# Patient Record
Sex: Male | Born: 1955 | Race: White | Hispanic: No | Smoking: Former smoker
Health system: Southern US, Community
[De-identification: ages and names within clinical notes are randomized; demographics above are authoritative.]

## PROBLEM LIST (undated history)

## (undated) DIAGNOSIS — Z8719 Personal history of other diseases of the digestive system: Secondary | ICD-10-CM

## (undated) DIAGNOSIS — Z9189 Other specified personal risk factors, not elsewhere classified: Secondary | ICD-10-CM

## (undated) DIAGNOSIS — K8020 Calculus of gallbladder without cholecystitis without obstruction: Secondary | ICD-10-CM

## (undated) DIAGNOSIS — M1990 Unspecified osteoarthritis, unspecified site: Secondary | ICD-10-CM

## (undated) DIAGNOSIS — N201 Calculus of ureter: Secondary | ICD-10-CM

## (undated) DIAGNOSIS — I4510 Unspecified right bundle-branch block: Secondary | ICD-10-CM

## (undated) HISTORY — PX: KNEE ARTHROSCOPY: SUR90

---

## 1998-05-14 ENCOUNTER — Ambulatory Visit (HOSPITAL_BASED_OUTPATIENT_CLINIC_OR_DEPARTMENT_OTHER): Admission: RE | Admit: 1998-05-14 | Discharge: 1998-05-14 | Payer: Self-pay | Admitting: Orthopedic Surgery

## 1998-12-09 ENCOUNTER — Ambulatory Visit (HOSPITAL_COMMUNITY): Admission: RE | Admit: 1998-12-09 | Discharge: 1998-12-09 | Payer: Self-pay | Admitting: Family Medicine

## 1998-12-09 ENCOUNTER — Encounter: Payer: Self-pay | Admitting: Family Medicine

## 1999-05-26 HISTORY — PX: ELBOW SURGERY: SHX618

## 2002-01-24 ENCOUNTER — Ambulatory Visit (HOSPITAL_BASED_OUTPATIENT_CLINIC_OR_DEPARTMENT_OTHER): Admission: RE | Admit: 2002-01-24 | Discharge: 2002-01-24 | Payer: Self-pay | Admitting: Orthopedic Surgery

## 2002-01-24 HISTORY — PX: OTHER SURGICAL HISTORY: SHX169

## 2002-10-07 ENCOUNTER — Encounter: Payer: Self-pay | Admitting: Emergency Medicine

## 2002-10-07 ENCOUNTER — Emergency Department (HOSPITAL_COMMUNITY): Admission: EM | Admit: 2002-10-07 | Discharge: 2002-10-07 | Payer: Self-pay | Admitting: Emergency Medicine

## 2004-10-07 ENCOUNTER — Inpatient Hospital Stay (HOSPITAL_COMMUNITY): Admission: RE | Admit: 2004-10-07 | Discharge: 2004-10-10 | Payer: Self-pay | Admitting: Orthopedic Surgery

## 2004-10-07 HISTORY — PX: TOTAL HIP ARTHROPLASTY: SHX124

## 2008-12-17 ENCOUNTER — Encounter: Payer: Self-pay | Admitting: Cardiovascular Disease

## 2009-10-30 ENCOUNTER — Encounter: Payer: Self-pay | Admitting: Cardiovascular Disease

## 2009-11-01 ENCOUNTER — Ambulatory Visit: Payer: Self-pay | Admitting: Cardiovascular Disease

## 2009-11-01 DIAGNOSIS — I4510 Unspecified right bundle-branch block: Secondary | ICD-10-CM

## 2009-11-26 ENCOUNTER — Ambulatory Visit: Payer: Self-pay | Admitting: Internal Medicine

## 2009-11-26 ENCOUNTER — Ambulatory Visit (HOSPITAL_COMMUNITY): Admission: RE | Admit: 2009-11-26 | Discharge: 2009-11-26 | Payer: Self-pay | Admitting: Cardiovascular Disease

## 2009-11-26 ENCOUNTER — Ambulatory Visit: Payer: Self-pay

## 2009-11-26 ENCOUNTER — Encounter: Payer: Self-pay | Admitting: Cardiovascular Disease

## 2009-11-26 HISTORY — PX: TRANSTHORACIC ECHOCARDIOGRAM: SHX275

## 2010-05-25 HISTORY — PX: TOTAL HIP REVISION: SHX763

## 2010-06-24 NOTE — Progress Notes (Signed)
Summary: [facility]  [facility] Note   Imported By: Roderic Ovens 11/19/2009 16:10:11  _____________________________________________________________________  External Attachment:    Type:   Image     Comment:   External Document

## 2010-06-24 NOTE — Assessment & Plan Note (Signed)
Summary: np6/ekg changes/jml   Visit Type:  new pt visit Referring Provider:  Rosanna Randy Primary Provider:  Rocky Crafts  CC:  Abnormal EKG.  History of Present Illness: 55 yo WM with history of hyperlipidemia, OA here today for cardiac evaluation prior to planned hip surgery. He tells  me that he had his right hip replaced in 2006 and now that prosthetic has been recalled. He is planning to have redo hip replacement on the right in the next month or two. He has been doing well. At routine visit in primary care, his EKG was noted to have an incomplete RBBB. He has had no prior cardiac issues. He denies chest pain, SOB or palpitations, near syncope or syncope. He is very active playing golf, kayaking, cycling and lifting weights.   Preventive Screening-Counseling & Management  Alcohol-Tobacco     Smoking Status: quit  Caffeine-Diet-Exercise     Does Patient Exercise: yes      Drug Use:  no.    Current Medications (verified): 1)  Xyzal 5 Mg Tabs (Levocetirizine Dihydrochloride) .Marland Kitchen.. 1 Tab Once Daily 2)  Vitamin D 2000 Unit Tabs (Cholecalciferol) .Marland Kitchen.. 1 Tab Once Daily 3)  Dhea 25 Mg Tabs (Prasterone (Dhea)) .... 1/4 Tab Once Daily  Allergies (verified): No Known Drug Allergies  Past History:  Past Medical History: Diverticulitis  Osteoarthritis Hyperlipidemia Incomplete RBBB  Past Surgical History: Status post right total hip arthroplasty 2006 Left elbow surgery in 1999 Right elbow surgery in 2001 Left and right knee arthorscopic surgeries  Family History: Father: deceased around 68 due to emphysema Mother-alive and healthy 2 brothers alive and well  Social History: Full Time for [employer] Married, 2 children Tobacco Use - Former. Smoked for 15 years, 2ppd. None since 1991.  Alcohol Use - yes Regular Exercise - yes Drug Use - no Smoking Status:  quit Does Patient Exercise:  yes Drug Use:  no  Review of Systems       The patient complains of joint pain.   The patient denies fatigue, malaise, fever, weight gain/loss, vision loss, decreased hearing, hoarseness, chest pain, palpitations, shortness of breath, prolonged cough, wheezing, sleep apnea, coughing up blood, abdominal pain, blood in stool, nausea, vomiting, diarrhea, heartburn, incontinence, blood in urine, muscle weakness, leg swelling, rash, skin lesions, headache, fainting, dizziness, depression, anxiety, enlarged lymph nodes, easy bruising or bleeding, and environmental allergies.    Vital Signs:  Patient profile:   55 year old male Height:      69 inches Weight:      176 pounds BMI:     26.08 Pulse rate:   63 / minute Pulse rhythm:   regular BP sitting:   100 / 70  (left arm) Cuff size:   large  Vitals Entered By: Danielle Rankin, CMA (November 01, 2009 3:39 PM)  Physical Exam  General:  General: Well developed, well nourished, NAD HEENT: OP clear, mucus membranes moist SKIN: warm, dry Neuro: No focal deficits Musculoskeletal: Muscle strength 5/5 all ext Psychiatric: Mood and affect normal Neck: No JVD, no carotid bruits, no thyromegaly, no lymphadenopathy. Lungs:Clear bilaterally, no wheezes, rhonci, crackles CV: RRR no murmurs, gallops rubs Abdomen: soft, NT, ND, BS present Extremities: No edema, pulses 2+.    EKG  Procedure date:  11/01/2009  Findings:      NSR, rate 63 bpm. Sinus arrhythmia. Incomplete RBBB.   Impression & Recommendations:  Problem # 1:  RIGHT BUNDLE BRANCH BLOCK (ICD-426.4) This is most likely benign. His only risk factor for  CAD is hyperlipidemia. He has no family history of premature CAD. He is very active and has had no chest pain or dyspnea on exertion. I will arrange an echocardiogram to exclude structural heart disease. I will call him with the results of the echo. We will see him as needed. I will appreciate the referral by Dr. Elisabeth Most and will communicate all results to her office.   Orders: EKG w/ Interpretation (93000) Echocardiogram  (Echo)  Patient Instructions: 1)  Your physician recommends that you schedule a follow-up appointment as needed 2)  Your physician has requested that you have an echocardiogram.  Echocardiography is a painless test that uses sound waves to create images of your heart. It provides your doctor with information about the size and shape of your heart and how well your heart's chambers and valves are working.  This procedure takes approximately one hour. There are no restrictions for this procedure.

## 2010-10-10 NOTE — Op Note (Signed)
Jay Davidson, Jay Davidson                            ACCOUNT NO.:  [id number]   MEDICAL RECORD NO.:  [id number]                   PATIENT TYPE:  AMB   LOCATION:  DSC                                  FACILITY:  [facility]   PHYSICIAN:  Katy Fitch. [name]., M.D.          DATE OF BIRTH:  [birth year]   DATE OF PROCEDURE:  01/24/2002  DATE OF DISCHARGE:                                 OPERATIVE REPORT   PREOPERATIVE DIAGNOSIS:  Chronic left lateral epicondylitis unresponsive to  more than three years of non-operative management.   POSTOPERATIVE DIAGNOSIS:  Chronic left lateral epicondylitis unresponsive to  more than three years of non-operative management.   OPERATION:  Reconstruction of left common extensor origin at elbow, lateral  epicondyle.   OPERATIVE SURGEON:  Katy Fitch. Sypher, M.D.   ASSISTANT:  [name], P.A.   ANESTHESIA:  Axillary block.   SUPERVISING ANESTHESIOLOGIST:  Maren Beach, M.D.   INDICATIONS:  The patient is a 55 year-old gentleman who has been a very  active athlete, employed by the [employer] service.  Between work and  recreational activities he has developed chronic lateral epicondylitis on  the left unresponsive to activity modification, work modification, splinting  and steroid injection.   Due to a failure to respond to non-operative measures he is brought to the  operating room at this time for reconstruction of his left condylar extensor  origin with a primary indication for surgery pain relief and secondary  indication for recovery of strength and work capacity.   PROCEDURE:  The patient was brought to the operating room and placed in a  supine position on the operative table.  Following placement of an axillary  block in the holding area by Dr. [name], anesthesia was complete in the left  arm.   One gram of Ancef was administered as a prophylactic antibiotic.   The entire left upper extremity was prepped with Betadine soak solution and  sterilely draped and a pneumatic tourniquet applied to the proximal  brachium.   Following exsanguination of the limb with an Esmarch bandage arterial  tourniquet on the proximal brachium was inflated to 220 mmHg.  The procedure  commenced with a 3 cm curvilinear incision posterior to the epicondyle  extending along the anterior aspect of the anconeus.   The tissues over the lateral epicondyle were carefully identified and  several transverse veins electrocauterized.   A 4 mm osteotome was used to elevate the common extensor origin including  the extensor carpi radialis, extensor carpi brevis and a small portion of  the brachial radialis until the muscle fibers of the brachial radialis were  identified.   On the deep surface of the extensor carpi radialis and brevis there was  cavitation and necrosis of the common extensor tendon.  There was  hyalinization of the tendinous origin.   The greater capitellar joint was opened and clear joint fluid identified.  There was no significant of significant tendon pathology involving the  common finger extensor.   The lateral epicondyle was decorticated and approximately 30 small drill  holes were created with a 0.035 inch Kirschner wire to bring blood supply  from the intermedullary canal.   Three drill holes were then placed through the lateral epicondyle utilizing  a Tapercut needle and bone awl.  A 4-0 Kevlar suture was then placed with  two horizontal mattress sutures securing the extensor carpi radialis, brevis  and longus back to an anatomic position followed by use of a running 4-0  Kevlar suture to finish the margin between the common extensor origin and  the anconeus.   The wound was then irrigated and repaired with subdermal sutures of 3-0  Vicryl and intradermal of 3-0 Prolene.   The patient was placed in a compressive dressing with his wrist in 40  degrees of dorsiflexion with a volar plaster splint maintaining this   position.   The wound was dressed with Xeroflo, sterile gauze and ace wrap.  There were  no apparent complications.   For after care the patient is given a prescription for Percocet 5/325 mg one  to two tablets p.o. q.4 to 6h p.r.n. pain.  Also he is given Keflex 500 mg  one p.o. q.8h times four hours as a prophylactic antibiotic.                                               Katy Fitch [name]., M.D.    RVS/MEDQ  D:  01/24/2002  T:  01/24/2002  Job:  [id number]

## 2010-10-10 NOTE — H&P (Signed)
NAMELABRIAN, Jay Davidson                  ACCOUNT NO.:  [id number]   MEDICAL RECORD NO.:  [id number]          PATIENT TYPE:   LOCATION:                                 FACILITY:   PHYSICIAN:  Georges Lynch. Gioffre, M.D.DATE OF BIRTH:  [birth year]   DATE OF ADMISSION:  DATE OF DISCHARGE:                                HISTORY & PHYSICAL   CHIEF COMPLAINT:  Right hip pain for several months.  The symptoms are  getting much worse.  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  The hip pain is making it difficult for him to do that.  The  patient was evaluated in the office and he was found to have degenerative  arthritis in his right hip and he elects to proceed with a right total hip  arthroplasty.   ALLERGIES:  NONE.   PAST MEDICAL HISTORY:  Diverticulitis and osteoarthritis.   CURRENT MEDICATIONS:  None.   PREVIOUS SURGICAL HISTORY:  The patient had debridement of his left knee in  1984, right knee arthroscopy in 1996, left elbow surgery in 1999 and right  elbow surgery in 2001.   FAMILY HISTORY:  Unavailable.   REVIEW OF SYSTEMS:  GENERAL:  Denies weight change, fever, chills, fatigue.  HEENT:  Denies headache, visual changes, tinnitus, hearing loss or sore  throat.  CARDIOVASCULAR:  Denies chest pain, palpitations, shortness of  breath, or orthopnea.  PULMONARY:  Denies dyspnea, wheezing, cough, sputum,  production, hemoptysis.  GASTROINTESTINAL:  Denies nausea and vomiting,  hematemesis or abdominal pain.  GENITOURINARY:  Denies dysuria, frequency,  urgency, hematuria.  ENDOCRINE:  Denies polyuria, polydipsia, appetite  changes, heat or cold intolerance.  NEUROLOGICAL:  Denies dizziness,  vertigo, syncope, seizures.  SKIN:  Denies itching, rashes, masses or moles.  MUSCULOSKELETAL:  Right hip pain.   PHYSICAL EXAMINATION:  VITAL SIGNS:  Temperature is 98.4, pulse is 76,  respirations 70, blood pressure 118/70.  GENERAL:  A 55 year old male in no acute  distress.  HEENT:  PERRLA.  EOM intact.  Pharynx clear.  NECK:  Supple without masses.  CHEST: Clear to auscultation bilaterally.  No wheezing, rales or rhonchi are  noted.  HEART:  Rate and rhythm regular without murmur.  ABDOMEN:  Positive bowel sounds, soft and nontender.  EXTREMITIES:  Examination of his right hip reveals painful range of motion  of his right hip.  SKIN:  Warm and dry.   X-RAYS:  Reveal degenerative arthritis of his right hip.   IMPRESSION:  Reveal degenerative arthritis of his right hip.   PLAN:  The patient is to be admitted to [facility] Oct 07, 2004  for a right total hip arthroplasty.      LKP/MEDQ  D:  10/06/2004  T:  10/06/2004  Job:  [id number]

## 2010-10-10 NOTE — Op Note (Signed)
NAMEPAULMICHAEL, SCHRECK                  ACCOUNT NO.:  [id number]   MEDICAL RECORD NO.:  [id number]          PATIENT TYPE:  INP   LOCATION:  0007                         [facility]ACILITY:  [facility]   PHYSICIAN:  Georges Lynch. Gioffre, M.D.DATE OF BIRTH:  01-11-1956   DATE OF PROCEDURE:  10/07/2004  DATE OF DISCHARGE:                                 OPERATIVE REPORT   CHIEF COMPLAINT:  Pain in the right hip. He had plain x-rays and MRI of his  right hip that showed degenerative arthritic changes.   PREOPERATIVE DIAGNOSIS:  Degenerative arthritis, right hip.   POSTOPERATIVE DIAGNOSIS:  Degenerative arthritis, right hip.   OPERATION:  Right total hip arthroplasty utilizing the DePuy metal and metal  total hip system. The cup was an ASR acetabular cup measuring 56 mm in  diameter, porous coated. The femoral implant, ____________ , was a size 49  ASR metal. The hip stem was a 11.3 porous-coated hip stem, with a lateral  offset. We utilized a +5 tapered sleeve.   ASSISTANT:  Madlyn Frankel. Charlann Boxer, M.D.   PROCEDURE:  Under general anesthesia, routine orthopedic prepping and  draping of the right hip was carried out. The patient had 1 g of IV Ancef.  At this time, posterolateral approach to the right hip was carried out;  bleeders then applied and cauterized. Self-retaining retractors were  inserted. I then incised the iliotibial band and then dissected the gluteal  muscle by blunt dissection. The self-retaining retractors were advanced. I  then partially detached external rotators and at this time did a  capsulectomy and dislocated the femoral head. We then amputated the femoral  head at the appropriate neck length. Following that, we inserted our canal  finder into the femoral shaft and the rasped the femoral shaft up to 11.3 mm  femoral stem. Once this was completed, we then completed our capsulectomy. I  then reamed the acetabulum out to a size 55 mm for a 56 mm cup. Then, we  went through the trials after we  inserted our permanent metal cup, and we  then finally selected a +5 tapered stem with a 49-mm bulb. We thoroughly  irrigated the area out, after we went through the trials, we removed the  trial, and then inserted our permanent size 11.3 femoral stem with a +5  tapered neck and a 49-mm bulb. We then made sure there were no soft tissue  structures in the acetabulum. We irrigated out the acetabulum, dried the  socket out, and then reduced the hip. It had excellent function and  excellent motion. Great attention was paid toward the achievement of leg  length equality. He was short about a half inch going into surgery. We were  satisfied with the leg lengths with the +5 tapered neck. We then thoroughly  irrigated out the area and reattached the soft tissue structures in usual  fashion. Skin was closed with metal staples, and a sterile neosporin  dressing was applied. The patient left the operating room in satisfactory  condition.      RAG/MEDQ  D:  10/07/2004  T:  10/07/2004  Job:  [id number]

## 2010-10-10 NOTE — Discharge Summary (Signed)
Jay Davidson, Jay Davidson                  ACCOUNT NO.:  [id number]   MEDICAL RECORD NO.:  [id number]          PATIENT TYPE:  INP   LOCATION:  1518                         FACILITY:  [facility]   PHYSICIAN:  Jay Davidson, M.D.DATE OF BIRTH:  1956/05/24   DATE OF ADMISSION:  10/07/2004  DATE OF DISCHARGE:  10/10/2004                                 DISCHARGE SUMMARY   ADMISSION DIAGNOSES:  1.  Degenerative arthritis right hip.  2.  Diverticulitis.  3.  Osteoarthritis.   DISCHARGE DIAGNOSES:  1.  Status post right total hip arthroplasty.  2.  Diverticulitis.  3.  Osteoarthritis.   PROCEDURES/OPERATIONS:  The patient had a right total knee arthroplasty on  Oct 07, 2004. Surgeon:  Jay Davidson, M.D. Assistant:  Jay Davidson, M.D.  Surgery was performed under general anesthesia.   BRIEF HISTORY:  This is a 55 year old male who has had right hip pain for  several months. His symptoms are progressing to the point where it is very  difficult for him to ambulate. 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 The pain is making it very difficult for him to do his  job. The patient was evaluated in the office and was found to have  significant degenerative arthritis of his right hip and would like to  proceed with a right total hip arthroplasty and was admitted to the hospital  for same.   LABORATORY DATA:  Admission CBC:  WBC 4.8, RBC 4.88, hemoglobin 14.8,  hematocrit 43.8, platelet count 253. Admission PT 11.5, INR 0.8, PTT 28.  Admission chemistry:  Sodium is 139, potassium is 4.5, chloride 103, CO2 29,  glucose 107, slightly elevated BUN 6, creatinine 1.0, calcium 9.6, total  protein 7.0, albumin 4.3. Admission urinalysis is normal. The patient's  blood type is O positive, negative antibody screen. Admission EKG:  Normal  sinus rhythm at a rate of 63. Admission chest x-ray:  No active disease.  Preoperative x-ray of his right hip revealed osteoarthritis of his right  hip. Postoperative x-ray of his right hip revealed right total hip  arthroplasty in good alignment.   HOSPITAL COURSE:  The patient was taken to the operating room. He underwent  the above-stated procedure without complication. He tolerated the procedure  well and was allowed to return to the recovery room and then the orthopedic  floor to continue his postoperative care. The patient was placed on PC  analgesia for pain control. The patient's pain was well controlled and he  tolerated the PCA well. He was weaned over to oral analgesics and the PCA  was discontinued on postoperative day #2. The patient's hemoglobin and  hematocrit was followed throughout his hospitalization. He had a  postoperative drop in his hemoglobin to 9.1 but that stabilized prior to  discharge and he did not require blood transfusions. PT was consulted for  gait training ambulation. He was able to partial weight-bear with the aid of  a walker without much difficulty. The patient did remarkably well and was  quite stable and was able to  be discharged home on postoperative day #3 -  the discharge date is Oct 10, 2004.   DISCHARGE MEDICATIONS:  1.  Coumadin 5 mg daily.  2.  Trinsicon one tablet twice daily.  3.  Percocet 10/650 one or two tablets q.6h. as needed for pain.  4.  Robaxin 500 mg one q.6h. as needed for spasm.   ACTIVITY:  Partial weightbearing with a walker.   WOUND CARE:  Daily dressing changes. Follow total hip precautions.   FOLLOW-UP:  The patient is scheduled to follow up with Dr. Darrelyn Davidson 2 weeks  from the date of surgery and he will call the office to schedule the  appointment.   CONDITION AT THE TIME OF DISCHARGE:  Stable.      Jay Davidson  D:  11/05/2004  T:  11/05/2004  Job:  [id number]

## 2011-04-21 DIAGNOSIS — T8484XA Pain due to internal orthopedic prosthetic devices, implants and grafts, initial encounter: Secondary | ICD-10-CM | POA: Insufficient documentation

## 2011-04-21 DIAGNOSIS — M169 Osteoarthritis of hip, unspecified: Secondary | ICD-10-CM | POA: Insufficient documentation

## 2011-06-18 ENCOUNTER — Telehealth: Payer: Self-pay | Admitting: Cardiovascular Disease

## 2011-06-18 NOTE — Telephone Encounter (Signed)
Echo,12 faxed to [facility] @ [phone] 06/18/11/KM

## 2011-12-28 ENCOUNTER — Ambulatory Visit (INDEPENDENT_AMBULATORY_CARE_PROVIDER_SITE_OTHER): Payer: 59 | Admitting: Family Medicine

## 2011-12-28 ENCOUNTER — Telehealth: Payer: Self-pay

## 2011-12-28 ENCOUNTER — Ambulatory Visit: Payer: 59

## 2011-12-28 VITALS — BP 110/70 | HR 54 | Temp 98.2°F | Resp 16 | Ht 68.0 in | Wt 178.2 lb

## 2011-12-28 DIAGNOSIS — S8990XA Unspecified injury of unspecified lower leg, initial encounter: Secondary | ICD-10-CM

## 2011-12-28 DIAGNOSIS — S4980XA Other specified injuries of shoulder and upper arm, unspecified arm, initial encounter: Secondary | ICD-10-CM

## 2011-12-28 DIAGNOSIS — M25569 Pain in unspecified knee: Secondary | ICD-10-CM

## 2011-12-28 NOTE — Telephone Encounter (Signed)
What is he taking now?  How much pain is he in?  Does he need to be able to work while taking this medication?

## 2011-12-28 NOTE — Telephone Encounter (Signed)
Patient saw Dr. Patsy Lager today for knee injury and refused pain meds. Patient now would like those called into [facility] [address]. Please call pt at [phone] when done.

## 2011-12-28 NOTE — Telephone Encounter (Signed)
See note, can we rx some medicine for pain?

## 2011-12-28 NOTE — Progress Notes (Signed)
[facility] [address] [place] [address] [phone]  Date:  12/28/2011   Name:  Jay Davidson   DOB:  05-24-1956   MRN:  [id number]  PCP:  No primary provider on file.    Chief Complaint: Knee Pain   History of Present Illness:  Jay Davidson is a 56 y.o. very pleasant male patient who presents with the following:  History of left knee problem from years ago. 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  He still has significant pain which makes it hard to walk.  He will occasionally have some knee pain, but never this severe since his original injury and surgery.   The knee is popping and clicking, and it does feel unstable.   He has an orthopedist at [facility]- he has had his right hip done (total hip) twice so far.    Patient Active Problem List  Diagnosis  . RIGHT BUNDLE BRANCH BLOCK    No past medical history on file.  No past surgical history on file.  History  Substance Use Topics  . Smoking status: Former Games developer  . Smokeless tobacco: Not on file  . Alcohol Use: Not on file    No family history on file.  No Known Allergies  Medication list has been reviewed and updated.  Current Outpatient Prescriptions on File Prior to Visit  Medication Sig Dispense Refill  . cetirizine (ZYRTEC) 10 MG tablet Take 10 mg by mouth daily.        Review of Systems:  As per HPI- otherwise negative.  Physical Examination: Filed Vitals:   12/28/11 0901  BP: 110/70  Pulse: 54  Temp: 98.2 F (36.8 C)  Resp: 16   Filed Vitals:   12/28/11 0901  Height: 5\' 8"  (1.727 m)  Weight: 178 lb 3.2 oz (80.831 kg)   Body mass index is 27.10 kg/(m^2). Ideal Body Weight: Weight in (lb) to have BMI = 25: 164.1    GEN: WDWN, NAD, Non-toxic, Alert & Oriented x 3 HEENT: Atraumatic, Normocephalic.  Ears and  Nose: No external deformity. EXTR: No clubbing/cyanosis/edema NEURO: Normal gait.  PSYCH: Normally interactive. Conversant. Not depressed or anxious appearing.  Calm demeanor.  Left knee:  Small effusion, mild swelling.  Tender along the front of the knee.  No crepitus, no heat or redness.   Feels stable.    [facility] reading (PRIMARY) by  Dr. Patsy Lager  Left knee:negative LEFT KNEE - COMPLETE 4+ VIEW  Comparison: None.  Findings: There is slight fullness on the lateral image of the density in the suprapatellar region. This suggest possibility of a small amount of joint effusion. Alignment is normal. Joint spaces are preserved. No fracture or dislocation is evident. No chondrocalcinosis or opaque loose body is evident. There is a small enthesophyte of the anterior superior aspect of the patella.  IMPRESSION: Question small joint effusion. Small enthesophyte of anterior superior aspect of patella. No other abnormalities identified.  Assessment and Plan: 1. Pain, knee    2. Injury, knee  DG Knee Complete 4 Views Left   Suspect that Trayden has re injured his meniscus.  Crutches, hinged knee brace, elevate, ice.  He will contact his orthopedist for follow- up, gave out of work note.  Let me know if we can help  in any way.   Abbe Amsterdam, MD

## 2011-12-29 MED ORDER — HYDROCODONE-ACETAMINOPHEN 5-325 MG PO TABS: 1.0000 | ORAL_TABLET | ORAL | Status: AC | PRN

## 2011-12-29 NOTE — Telephone Encounter (Signed)
Left message for him to call me back to advise on this.

## 2011-12-29 NOTE — Telephone Encounter (Signed)
Faxed in Rx and notified pt done.

## 2011-12-29 NOTE — Telephone Encounter (Signed)
Done and printed

## 2011-12-29 NOTE — Telephone Encounter (Signed)
I have spoken to patient he is s/p hip replacement and is taking tramadol with no relief he is home out of work with his new knee injury, he requests something stronger sent to [facility].

## 2012-11-30 ENCOUNTER — Encounter (HOSPITAL_COMMUNITY): Payer: Self-pay | Admitting: *Deleted

## 2012-11-30 ENCOUNTER — Emergency Department (HOSPITAL_COMMUNITY): Payer: Managed Care, Other (non HMO)

## 2012-11-30 ENCOUNTER — Emergency Department (HOSPITAL_COMMUNITY)
Admission: EM | Admit: 2012-11-30 | Discharge: 2012-11-30 | Disposition: A | Discharge status: Home / Self Care | Payer: Managed Care, Other (non HMO) | Attending: Emergency Medicine | Admitting: Emergency Medicine

## 2012-11-30 DIAGNOSIS — N200 Calculus of kidney: Secondary | ICD-10-CM | POA: Insufficient documentation

## 2012-11-30 DIAGNOSIS — Z79899 Other long term (current) drug therapy: Secondary | ICD-10-CM | POA: Insufficient documentation

## 2012-11-30 DIAGNOSIS — Z87891 Personal history of nicotine dependence: Secondary | ICD-10-CM | POA: Insufficient documentation

## 2012-11-30 DIAGNOSIS — K8020 Calculus of gallbladder without cholecystitis without obstruction: Secondary | ICD-10-CM | POA: Insufficient documentation

## 2012-11-30 DIAGNOSIS — R110 Nausea: Secondary | ICD-10-CM | POA: Insufficient documentation

## 2012-11-30 DIAGNOSIS — R300 Dysuria: Secondary | ICD-10-CM | POA: Insufficient documentation

## 2012-11-30 DIAGNOSIS — Z7982 Long term (current) use of aspirin: Secondary | ICD-10-CM | POA: Insufficient documentation

## 2012-11-30 LAB — BASIC METABOLIC PANEL
CO2: 30 mEq/L (ref 19–32)
Calcium: 9.6 mg/dL (ref 8.4–10.5)
Chloride: 99 mEq/L (ref 96–112)
GFR calc Af Amer: >90 mL/min (ref >90)
Sodium: 137 mEq/L (ref 135–145)

## 2012-11-30 LAB — CBC WITH DIFFERENTIAL/PLATELET
Basophils Absolute: 0 10*3/uL (ref 0.0–0.1)
Basophils Relative: 0 % (ref 0–1)
Eosinophils Relative: 1 % (ref 0–5)
Lymphocytes Relative: 13 % (ref 12–46)
MCHC: 33.7 g/dL (ref 30.0–36.0)
Neutro Abs: 7.9 10*3/uL — ABNORMAL HIGH (ref 1.7–7.7)
Platelets: 252 10*3/uL (ref 150–400)
RDW: 12.3 % (ref 11.5–15.5)
WBC: 9.8 10*3/uL (ref 4.0–10.5)

## 2012-11-30 LAB — URINALYSIS, ROUTINE W REFLEX MICROSCOPIC
Bilirubin Urine: NEGATIVE
Ketones, ur: NEGATIVE mg/dL
Specific Gravity, Urine: 1.03 (ref 1.005–1.030)
Urobilinogen, UA: 0.2 mg/dL (ref 0.0–1.0)

## 2012-11-30 LAB — URINE MICROSCOPIC-ADD ON

## 2012-11-30 MED ORDER — OXYCODONE-ACETAMINOPHEN 5-325 MG PO TABS
2.0000 | ORAL_TABLET | Freq: Once | ORAL | Status: AC
  Administered 2012-11-30: 2 via ORAL
  Filled 2012-11-30: qty 2

## 2012-11-30 MED ORDER — ONDANSETRON HCL 4 MG PO TABS: 4.0000 mg | ORAL_TABLET | Freq: Three times a day (TID) | ORAL | Status: DC | PRN

## 2012-11-30 MED ORDER — OXYCODONE-ACETAMINOPHEN 5-325 MG PO TABS: ORAL_TABLET | ORAL | Status: DC

## 2012-11-30 MED ORDER — TAMSULOSIN HCL 0.4 MG PO CAPS: 0.4000 mg | ORAL_CAPSULE | Freq: Every day | ORAL | Status: DC

## 2012-11-30 MED ORDER — ONDANSETRON 4 MG PO TBDP
4.0000 mg | ORAL_TABLET | Freq: Once | ORAL | Status: AC
  Administered 2012-11-30: 4 mg via ORAL
  Filled 2012-11-30: qty 1

## 2012-11-30 NOTE — ED Notes (Signed)
Pt c/o sudden onset left flank pain at 1600 today. Pt unable to sit still in triage.

## 2012-11-30 NOTE — ED Provider Notes (Signed)
History    CSN: [id number] Arrival date & time 11/30/12  1735  First MD Initiated Contact with Patient 11/30/12 1811     Chief Complaint  Patient presents with  . Flank Pain   (Consider location/radiation/quality/duration/timing/severity/associated sxs/prior Treatment) HPI Comments: 57 y.o. Male with no significant medical hx presents today complaining of sudden onset left flank pain that started about 4pm today. This is a new problem. Pt describes pain as sharp, constant, worsening severe at 10/10, radiating to left groin. Worse with sitting up. Admits dysuria. Denies fever, chills, nausea, vomiting, hematuria, chest pain, shortness of breath.   Patient is a 57 y.o. male presenting with flank pain.  Flank Pain Associated symptoms include nausea and urinary symptoms. Pertinent negatives include no abdominal pain, change in bowel habit, chest pain, chills, coughing, diaphoresis, fever, headaches, neck pain, numbness, rash, vomiting or weakness.   History reviewed. No pertinent past medical history. History reviewed. No pertinent past surgical history. History reviewed. No pertinent family history. History  Substance Use Topics  . Smoking status: Former Games developer  . Smokeless tobacco: Not on file  . Alcohol Use: Yes    Review of Systems  Constitutional: Negative for fever, chills and diaphoresis.  HENT: Negative for neck pain and neck stiffness.   Eyes: Negative for visual disturbance.  Respiratory: Negative for apnea, cough, chest tightness and shortness of breath.   Cardiovascular: Negative for chest pain and palpitations.  Gastrointestinal: Positive for nausea. Negative for vomiting, abdominal pain, diarrhea, constipation and change in bowel habit.  Genitourinary: Positive for dysuria and flank pain.       Left sided  Musculoskeletal: Negative for gait problem.  Skin: Negative for rash.  Neurological: Negative for dizziness, weakness, light-headedness, numbness and headaches.     Allergies  Review of patient's allergies indicates no known allergies.  Home Medications   Current Outpatient Rx  Name  Route  Sig  Dispense  Refill  . aspirin 81 MG chewable tablet   Oral   Chew 81 mg by mouth daily.         . Multiple Vitamins-Minerals (MULTIVITAMIN WITH MINERALS) tablet   Oral   Take 1 tablet by mouth daily.         . traMADol (ULTRAM) 50 MG tablet   Oral   Take 50 mg by mouth every 6 (six) hours as needed (pain).           BP 178/59  Pulse 58  Temp(Src) 98.4 F (36.9 C) (Oral)  Resp 20  SpO2 99% Physical Exam  Nursing note and vitals reviewed. Constitutional: He is oriented to person, place, and time. He appears well-developed and well-nourished. No distress.  uncomfortable  HENT:  Head: Normocephalic and atraumatic.  Eyes: Conjunctivae and EOM are normal.  Neck: Normal range of motion. Neck supple.  No meningeal signs  Cardiovascular: Normal rate, regular rhythm and normal heart sounds.  Exam reveals no gallop and no friction rub.   No murmur heard. Pulmonary/Chest: Effort normal and breath sounds normal. No respiratory distress. He has no wheezes. He has no rales. He exhibits no tenderness.  Abdominal: Soft. Bowel sounds are normal. He exhibits no distension. There is tenderness. There is no rebound and no guarding.    Left CVA tenderness radiating to left groin  Musculoskeletal: Normal range of motion. He exhibits no edema and no tenderness.  Neurological: He is alert and oriented to person, place, and time. No cranial nerve deficit.  Skin: Skin is warm and dry. He  is not diaphoretic. No erythema.       ED Course  Procedures (including critical care time)  Medications  oxyCODONE-acetaminophen (PERCOCET/ROXICET) 5-325 MG per tablet 2 tablet (2 tablets Oral Given 11/30/12 1902)  ondansetron (ZOFRAN-ODT) disintegrating tablet 4 mg (4 mg Oral Given 11/30/12 1903)    Labs Reviewed  URINALYSIS, ROUTINE W REFLEX MICROSCOPIC -  Abnormal; Notable for the following:    APPearance CLOUDY (*)    Hgb urine dipstick LARGE (*)    All other components within normal limits  CBC WITH DIFFERENTIAL - Abnormal; Notable for the following:    Neutrophils Relative % 81 (*)    Neutro Abs 7.9 (*)    All other components within normal limits  BASIC METABOLIC PANEL - Abnormal; Notable for the following:    Glucose, Bld 121 (*)    All other components within normal limits  URINE MICROSCOPIC-ADD ON   Ct Abdomen Pelvis Wo Contrast  11/30/2012   *RADIOLOGY REPORT*  Clinical Data: Left flank pain, left groin pain.  CT ABDOMEN AND PELVIS WITHOUT CONTRAST  Technique:  Multidetector CT imaging of the abdomen and pelvis was performed following the standard protocol without intravenous contrast.  Comparison: None  Findings: Lung bases are clear.  No effusions.  Heart is normal size.  11 mm gallstone layering dependently in the gallbladder fundus. Small low-density lesion in the right hepatic lobe measures 8 mm, likely small cyst although this cannot be fully characterized due to small size and lack of intravenous contrast.  Spleen, pancreas, adrenals and right kidney have an unremarkable unenhanced appearance.  There is mild left hydronephrosis due to a 6 mm stone in the mid left ureter at the pelvic brim.  The ureter is decompressed below this level.  Urinary bladder is decompressed, grossly unremarkable.  Appendix is visualized and is normal. Bowel grossly unremarkable.  No free fluid, free air, or adenopathy.  Prior right hip replacement.  No acute bony abnormality.  Aorta is normal caliber.  IMPRESSION: 6 mm mid left ureteral stone with mild left hydronephrosis.  Cholelithiasis.   Original Report Authenticated By: Charlett Nose, M.D.   1. Nephrolithiasis   2. Gall stones    Filed Vitals:   11/30/12 1745  BP: 178/59  Pulse: 58  Temp: 98.4 F (36.9 C)  TempSrc: Oral  Resp: 20  SpO2: 99%    New Prescriptions   ONDANSETRON (ZOFRAN) 4 MG  TABLET    Take 1 tablet (4 mg total) by mouth every 8 (eight) hours as needed for nausea.   OXYCODONE-ACETAMINOPHEN (PERCOCET/ROXICET) 5-325 MG PER TABLET    Take one or two tablets every 4 to 6 hours as needed for pain   TAMSULOSIN (FLOMAX) 0.4 MG CAPS    Take 1 capsule (0.4 mg total) by mouth daily.    MDM  Pt has been diagnosed with a Kidney Stone via CT with mild hydronephrosis. Serum creatine WNL, vitals sign stable and the pt does not have irratractable vomiting. Incidental finding of asymptomatic gall stones on CT. Pt has no complaints of RUQ pain today or pain after eating. Pt denies diet containing fatty foods and has never experienced RUQ pain.  At this time there does not appear to be any evidence of an acute emergency medical condition and the patient appears stable for discharge with appropriate outpatient follow up. Provided urology contact info, outpt surgery with [facility] for gall stones, pain meds, zofran, flomax, and strainer. Diagnosis was discussed with patient who verbalizes understanding and is  agreeable to discharge. Pt case discussed with Dr. Dierdre Highman who agrees with my plan.    Glade Nurse, PA-C 11/30/12 2335

## 2012-11-30 NOTE — ED Provider Notes (Signed)
Medical screening examination/treatment/procedure(s) were performed by non-physician practitioner and as supervising physician I was immediately available for consultation/collaboration.  Sunnie Nielsen, MD 11/30/12 [phone]

## 2012-12-13 ENCOUNTER — Ambulatory Visit (INDEPENDENT_AMBULATORY_CARE_PROVIDER_SITE_OTHER): Payer: 59 | Admitting: Emergency Medicine

## 2012-12-13 VITALS — BP 144/86 | HR 75 | Temp 98.5°F | Resp 18 | Ht 68.0 in | Wt 177.0 lb

## 2012-12-13 DIAGNOSIS — L0201 Cutaneous abscess of face: Secondary | ICD-10-CM

## 2012-12-13 DIAGNOSIS — L03211 Cellulitis of face: Secondary | ICD-10-CM

## 2012-12-13 MED ORDER — CLINDAMYCIN HCL 300 MG PO CAPS: 300.0000 mg | ORAL_CAPSULE | Freq: Three times a day (TID) | ORAL | Status: DC

## 2012-12-13 NOTE — Progress Notes (Signed)
[facility] [address], [place] [address] [phone]  Date:  12/13/2012   Name:  Jay Davidson   DOB:  [birth year]   MRN:  [id number]  PCP:  No primary provider on file.    Chief Complaint: infected nose hair   History of Present Illness:  Jay Davidson is a 57 y.o. very pleasant male patient who presents with the following:  Sudden onset of swelling and pain in his left side of the nose.  No fever or chills.  No history of injury.  No improvement with over the counter medications or other home remedies. Denies other complaint or health concern today.   Patient Active Problem List   Diagnosis Date Noted  . RIGHT BUNDLE BRANCH BLOCK 11/01/2009    Past Medical History  Diagnosis Date  . Allergy     Past Surgical History  Procedure Laterality Date  . Total hip arthroplasty      History  Substance Use Topics  . Smoking status: Former Games developer  . Smokeless tobacco: Not on file  . Alcohol Use: Yes    History reviewed. No pertinent family history.  No Known Allergies  Medication list has been reviewed and updated.  Current Outpatient Prescriptions on File Prior to Visit  Medication Sig Dispense Refill  . aspirin 81 MG chewable tablet Chew 81 mg by mouth daily.      . Multiple Vitamins-Minerals (MULTIVITAMIN WITH MINERALS) tablet Take 1 tablet by mouth daily.      Marland Kitchen oxyCODONE-acetaminophen (PERCOCET/ROXICET) 5-325 MG per tablet Take one or two tablets every 4 to 6 hours as needed for pain  20 tablet  0  . tamsulosin (FLOMAX) 0.4 MG CAPS Take 1 capsule (0.4 mg total) by mouth daily.  10 capsule  0  . traMADol (ULTRAM) 50 MG tablet Take 50 mg by mouth every 6 (six) hours as needed (pain).       . ondansetron (ZOFRAN) 4 MG tablet Take 1 tablet (4 mg total) by mouth every 8 (eight) hours as needed for nausea.  10 tablet  0   No current facility-administered medications on file prior to visit.    Review of Systems:  As per HPI, otherwise  negative.    Physical Examination: Filed Vitals:   12/13/12 1655  BP: 144/86  Pulse: 75  Temp: 98.5 F (36.9 C)  Resp: 18   Filed Vitals:   12/13/12 1655  Height: 5\' 8"  (1.727 m)  Weight: 177 lb (80.287 kg)   Body mass index is 26.92 kg/(m^2). Ideal Body Weight: Weight in (lb) to have BMI = 25: 164.1   GEN: WDWN, NAD, Non-toxic, Alert & Oriented x 3 HEENT: Atraumatic, Normocephalic.  Ears and Nose: No external deformity. EXTR: No clubbing/cyanosis/edema NEURO: Normal gait.  PSYCH: Normally interactive. Conversant. Not depressed or anxious appearing.  Calm demeanor.  Has a swollen erythematous nose more tender on left side.  No lymphangitis.  Not fluctuant.  Assessment and Plan: Cellulitis nose Clindamycin  Signed,  Phillips Odor, MD

## 2012-12-13 NOTE — Patient Instructions (Addendum)
Abscess An abscess is an infected area that contains a collection of pus and debris.It can occur in almost any part of the body. An abscess is also known as a furuncle or boil. CAUSES  An abscess occurs when tissue gets infected. This can occur from blockage of oil or sweat glands, infection of hair follicles, or a minor injury to the skin. As the body tries to fight the infection, pus collects in the area and creates pressure under the skin. This pressure causes pain. People with weakened immune systems have difficulty fighting infections and get certain abscesses more often.  SYMPTOMS Usually an abscess develops on the skin and becomes a painful mass that is red, warm, and tender. If the abscess forms under the skin, you may feel a moveable soft area under the skin. Some abscesses break open (rupture) on their own, but most will continue to get worse without care. The infection can spread deeper into the body and eventually into the bloodstream, causing you to feel ill.  DIAGNOSIS  Your caregiver will take your medical history and perform a physical exam. A sample of fluid may also be taken from the abscess to determine what is causing your infection. TREATMENT  Your caregiver may prescribe antibiotic medicines to fight the infection. However, taking antibiotics alone usually does not cure an abscess. Your caregiver may need to make a small cut (incision) in the abscess to drain the pus. In some cases, gauze is packed into the abscess to reduce pain and to continue draining the area. HOME CARE INSTRUCTIONS   Only take over-the-counter or prescription medicines for pain, discomfort, or fever as directed by your caregiver.  If you were prescribed antibiotics, take them as directed. Finish them even if you start to feel better.  If gauze is used, follow your caregiver's directions for changing the gauze.  To avoid spreading the infection:  Keep your draining abscess covered with a  bandage.  Wash your hands well.  Do not share personal care items, towels, or whirlpools with others.  Avoid skin contact with others.  Keep your skin and clothes clean around the abscess.  Keep all follow-up appointments as directed by your caregiver. SEEK MEDICAL CARE IF:   You have increased pain, swelling, redness, fluid drainage, or bleeding.  You have muscle aches, chills, or a general ill feeling.  You have a fever. MAKE SURE YOU:   Understand these instructions.  Will watch your condition.  Will get help right away if you are not doing well or get worse. Document Released: 02/18/2005 Document Revised: 11/10/2011 Document Reviewed: 07/24/2011 [employer] Patient Information 2014 [employer].  

## 2013-03-29 ENCOUNTER — Ambulatory Visit: Payer: 59

## 2013-05-03 ENCOUNTER — Ambulatory Visit (INDEPENDENT_AMBULATORY_CARE_PROVIDER_SITE_OTHER): Payer: 59 | Admitting: Family Medicine

## 2013-05-03 VITALS — BP 122/70 | HR 62 | Temp 98.4°F | Resp 18 | Ht 68.0 in | Wt 179.0 lb

## 2013-05-03 DIAGNOSIS — S43429A Sprain of unspecified rotator cuff capsule, initial encounter: Secondary | ICD-10-CM

## 2013-05-03 DIAGNOSIS — S43422A Sprain of left rotator cuff capsule, initial encounter: Secondary | ICD-10-CM

## 2013-05-03 MED ORDER — HYDROCODONE-ACETAMINOPHEN 5-325 MG PO TABS: 1.0000 | ORAL_TABLET | Freq: Four times a day (QID) | ORAL | Status: DC | PRN

## 2013-05-03 MED ORDER — CYCLOBENZAPRINE HCL 10 MG PO TABS: 10.0000 mg | ORAL_TABLET | Freq: Three times a day (TID) | ORAL | Status: DC | PRN

## 2013-05-03 MED ORDER — DICLOFENAC SODIUM 75 MG PO TBEC: 75.0000 mg | DELAYED_RELEASE_TABLET | Freq: Two times a day (BID) | ORAL | Status: DC

## 2013-05-03 NOTE — Patient Instructions (Signed)
Ice should be applied for 10 to 15 minutes every 2 to 3 hours for inflammation and pain, and immediately after activity that aggravates your symptoms. Use ice packs or an ice massage. OK to baby shoulder for 2-3 days with medications and frequent icing. Then start below stretching exercises and in another week can start the strengthening exercises.  If you are worsening, return to clinic for xray and repeat exam - then we will determine if you can be further treated best by physical therapy or if you need an MRI and/or referral to an orthopedist.  If you are getting somewhat better, ok to wait until 4 weeks but if not all the way improved by 6 weeks recommend return to clinic for further evaluation. Do not use any other otc pain medication other than tylenol/acetaminophen - so no aleve, ibuprofen, motrin, advil, etc while you are on the diclofenac.  Impingement Syndrome, Rotator Cuff, Bursitis with Rehab Impingement syndrome is a condition that involves inflammation of the tendons of the rotator cuff and the subacromial bursa, that causes pain in the shoulder. The rotator cuff consists of four tendons and muscles that control much of the shoulder and upper arm function. The subacromial bursa is a fluid filled sac that helps reduce friction between the rotator cuff and one of the bones of the shoulder (acromion). Impingement syndrome is usually an overuse injury that causes swelling of the bursa (bursitis), swelling of the tendon (tendonitis), and/or a tear of the tendon (strain). Strains are classified into three categories. Grade 1 strains cause pain, but the tendon is not lengthened. Grade 2 strains include a lengthened ligament, due to the ligament being stretched or partially ruptured. With grade 2 strains there is still function, although the function may be decreased. Grade 3 strains include a complete tear of the tendon or muscle, and function is usually impaired. SYMPTOMS   Pain around the shoulder,  often at the outer portion of the upper arm.  Pain that gets worse with shoulder function, especially when reaching overhead or lifting.  Sometimes, aching when not using the arm.  Pain that wakes you up at night.  Sometimes, tenderness, swelling, warmth, or redness over the affected area.  Loss of strength.  Limited motion of the shoulder, especially reaching behind the back (to the back pocket or to unhook bra) or across your body.  Crackling sound (crepitation) when moving the arm.  Biceps tendon pain and inflammation (in the front of the shoulder). Worse when bending the elbow or lifting. CAUSES  Impingement syndrome is often an overuse injury, in which chronic (repetitive) motions cause the tendons or bursa to become inflamed. A strain occurs when a force is paced on the tendon or muscle that is greater than it can withstand. Common mechanisms of injury include: Stress from sudden increase in duration, frequency, or intensity of training.  Direct hit (trauma) to the shoulder.  Aging, erosion of the tendon with normal use.  Bony bump on shoulder (acromial spur). RISK INCREASES WITH:  Contact sports (football, wrestling, boxing).  Throwing sports (baseball, tennis, volleyball).  Weightlifting and bodybuilding.  Heavy labor.  Previous injury to the rotator cuff, including impingement.  Poor shoulder strength and flexibility.  Failure to warm up properly before activity.  Inadequate protective equipment.  Old age.  Bony bump on shoulder (acromial spur). PREVENTION   Warm up and stretch properly before activity.  Allow for adequate recovery between workouts.  Maintain physical fitness:  Strength, flexibility, and endurance.  Cardiovascular  fitness.  Learn and use proper exercise technique. PROGNOSIS  If treated properly, impingement syndrome usually goes away within 6 weeks. Sometimes surgery is required.  RELATED COMPLICATIONS   Longer healing time if  not properly treated, or if not given enough time to heal.  Recurring symptoms, that result in a chronic condition.  Shoulder stiffness, frozen shoulder, or loss of motion.  Rotator cuff tendon tear.  Recurring symptoms, especially if activity is resumed too soon, with overuse, with a direct blow, or when using poor technique. TREATMENT  Treatment first involves the use of ice and medicine, to reduce pain and inflammation. The use of strengthening and stretching exercises may help reduce pain with activity. These exercises may be performed at home or with a therapist. If non-surgical treatment is unsuccessful after more than 6 months, surgery may be advised. After surgery and rehabilitation, activity is usually possible in 3 months.  MEDICATION  If pain medicine is needed, nonsteroidal anti-inflammatory medicines (aspirin and ibuprofen), or other minor pain relievers (acetaminophen), are often advised.  Do not take pain medicine for 7 days before surgery.  Prescription pain relievers may be given, if your caregiver thinks they are needed. Use only as directed and only as much as you need.  Corticosteroid injections may be given by your caregiver. These injections should be reserved for the most serious cases, because they may only be given a certain number of times. HEAT AND COLD  Cold treatment (icing) should be applied for 10 to 15 minutes every 2 to 3 hours for inflammation and pain, and immediately after activity that aggravates your symptoms. Use ice packs or an ice massage.  Heat treatment may be used before performing stretching and strengthening activities prescribed by your caregiver, physical therapist, or athletic trainer. Use a heat pack or a warm water soak. SEEK MEDICAL CARE IF:   Symptoms get worse or do not improve in 4 to 6 weeks, despite treatment.  New, unexplained symptoms develop. (Drugs used in treatment may produce side effects.) EXERCISES  RANGE OF MOTION (ROM)  AND STRETCHING EXERCISES - Impingement Syndrome (Rotator Cuff  Tendinitis, Bursitis) These exercises may help you when beginning to rehabilitate your injury. Your symptoms may go away with or without further involvement from your physician, physical therapist or athletic trainer. While completing these exercises, remember:   Restoring tissue flexibility helps normal motion to return to the joints. This allows healthier, less painful movement and activity.  An effective stretch should be held for at least 30 seconds.  A stretch should never be painful. You should only feel a gentle lengthening or release in the stretched tissue. STRETCH  Flexion, Standing  Stand with good posture. With an underhand grip on your right / left hand, and an overhand grip on the opposite hand, grasp a broomstick or cane so that your hands are a little more than shoulder width apart.  Keeping your right / left elbow straight and shoulder muscles relaxed, push the stick with your opposite hand, to raise your right / left arm in front of your body and then overhead. Raise your arm until you feel a stretch in your right / left shoulder, but before you have increased shoulder pain.  Try to avoid shrugging your right / left shoulder as your arm rises, by keeping your shoulder blade tucked down and toward your mid-back spine. Hold for __________ seconds.  Slowly return to the starting position. Repeat __________ times. Complete this exercise __________ times per day. STRETCH  Abduction,  Supine  Lie on your back. With an underhand grip on your right / left hand and an overhand grip on the opposite hand, grasp a broomstick or cane so that your hands are a little more than shoulder width apart.  Keeping your right / left elbow straight and your shoulder muscles relaxed, push the stick with your opposite hand, to raise your right / left arm out to the side of your body and then overhead. Raise your arm until you feel a stretch  in your right / left shoulder, but before you have increased shoulder pain.  Try to avoid shrugging your right / left shoulder as your arm rises, by keeping your shoulder blade tucked down and toward your mid-back spine. Hold for __________ seconds.  Slowly return to the starting position. Repeat __________ times. Complete this exercise __________ times per day. ROM  Flexion, Active-Assisted  Lie on your back. You may bend your knees for comfort.  Grasp a broomstick or cane so your hands are about shoulder width apart. Your right / left hand should grip the end of the stick, so that your hand is positioned "thumbs-up," as if you were about to shake hands.  Using your healthy arm to lead, raise your right / left arm overhead, until you feel a gentle stretch in your shoulder. Hold for __________ seconds.  Use the stick to assist in returning your right / left arm to its starting position. Repeat __________ times. Complete this exercise __________ times per day.  ROM - Internal Rotation, Supine   Lie on your back on a firm surface. Place your right / left elbow about 60 degrees away from your side. Elevate your elbow with a folded towel, so that the elbow and shoulder are the same height.  Using a broomstick or cane and your strong arm, pull your right / left hand toward your body until you feel a gentle stretch, but no increase in your shoulder pain. Keep your shoulder and elbow in place throughout the exercise.  Hold for __________ seconds. Slowly return to the starting position. Repeat __________ times. Complete this exercise __________ times per day. STRETCH - Internal Rotation  Place your right / left hand behind your back, palm up.  Throw a towel or belt over your opposite shoulder. Grasp the towel with your right / left hand.  While keeping an upright posture, gently pull up on the towel, until you feel a stretch in the front of your right / left shoulder.  Avoid shrugging your  right / left shoulder as your arm rises, by keeping your shoulder blade tucked down and toward your mid-back spine.  Hold for __________ seconds. Release the stretch, by lowering your healthy hand. Repeat __________ times. Complete this exercise __________ times per day. ROM - Internal Rotation   Using an underhand grip, grasp a stick behind your back with both hands.  While standing upright with good posture, slide the stick up your back until you feel a mild stretch in the front of your shoulder.  Hold for __________ seconds. Slowly return to your starting position. Repeat __________ times. Complete this exercise __________ times per day.  STRETCH  Posterior Shoulder Capsule   Stand or sit with good posture. Grasp your right / left elbow and draw it across your chest, keeping it at the same height as your shoulder.  Pull your elbow, so your upper arm comes in closer to your chest. Pull until you feel a gentle stretch in the back  of your shoulder.  Hold for __________ seconds. Repeat __________ times. Complete this exercise __________ times per day. STRENGTHENING EXERCISES - Impingement Syndrome (Rotator Cuff Tendinitis, Bursitis) These exercises may help you when beginning to rehabilitate your injury. They may resolve your symptoms with or without further involvement from your physician, physical therapist or athletic trainer. While completing these exercises, remember:  Muscles can gain both the endurance and the strength needed for everyday activities through controlled exercises.  Complete these exercises as instructed by your physician, physical therapist or athletic trainer. Increase the resistance and repetitions only as guided.  You may experience muscle soreness or fatigue, but the pain or discomfort you are trying to eliminate should never worsen during these exercises. If this pain does get worse, stop and make sure you are following the directions exactly. If the pain is still  present after adjustments, discontinue the exercise until you can discuss the trouble with your clinician.  During your recovery, avoid activity or exercises which involve actions that place your injured hand or elbow above your head or behind your back or head. These positions stress the tissues which you are trying to heal. STRENGTH - Scapular Depression and Adduction   With good posture, sit on a firm chair. Support your arms in front of you, with pillows, arm rests, or on a table top. Have your elbows in line with the sides of your body.  Gently draw your shoulder blades down and toward your mid-back spine. Gradually increase the tension, without tensing the muscles along the top of your shoulders and the back of your neck.  Hold for __________ seconds. Slowly release the tension and relax your muscles completely before starting the next repetition.  After you have practiced this exercise, remove the arm support and complete the exercise in standing as well as sitting position. Repeat __________ times. Complete this exercise __________ times per day.  STRENGTH - Shoulder Abductors, Isometric  With good posture, stand or sit about 4-6 inches from a wall, with your right / left side facing the wall.  Bend your right / left elbow. Gently press your right / left elbow into the wall. Increase the pressure gradually, until you are pressing as hard as you can, without shrugging your shoulder or increasing any shoulder discomfort.  Hold for __________ seconds.  Release the tension slowly. Relax your shoulder muscles completely before you begin the next repetition. Repeat __________ times. Complete this exercise __________ times per day.  STRENGTH - External Rotators, Isometric  Keep your right / left elbow at your side and bend it 90 degrees.  Step into a door frame so that the outside of your right / left wrist can press against the door frame without your upper arm leaving your  side.  Gently press your right / left wrist into the door frame, as if you were trying to swing the back of your hand away from your stomach. Gradually increase the tension, until you are pressing as hard as you can, without shrugging your shoulder or increasing any shoulder discomfort.  Hold for __________ seconds.  Release the tension slowly. Relax your shoulder muscles completely before you begin the next repetition. Repeat __________ times. Complete this exercise __________ times per day.  STRENGTH - Supraspinatus   Stand or sit with good posture. Grasp a __________ weight, or an exercise band or tubing, so that your hand is "thumbs-up," like you are shaking hands.  Slowly lift your right / left arm in a "V" away  from your thigh, diagonally into the space between your side and straight ahead. Lift your hand to shoulder height or as far as you can, without increasing any shoulder pain. At first, many people do not lift their hands above shoulder height.  Avoid shrugging your right / left shoulder as your arm rises, by keeping your shoulder blade tucked down and toward your mid-back spine.  Hold for __________ seconds. Control the descent of your hand, as you slowly return to your starting position. Repeat __________ times. Complete this exercise __________ times per day.  STRENGTH - External Rotators  Secure a rubber exercise band or tubing to a fixed object (table, pole) so that it is at the same height as your right / left elbow when you are standing or sitting on a firm surface.  Stand or sit so that the secured exercise band is at your uninjured side.  Bend your right / left elbow 90 degrees. Place a folded towel or small pillow under your right / left arm, so that your elbow is a few inches away from your side.  Keeping the tension on the exercise band, pull it away from your body, as if pivoting on your elbow. Be sure to keep your body steady, so that the movement is coming only  from your rotating shoulder.  Hold for __________ seconds. Release the tension in a controlled manner, as you return to the starting position. Repeat __________ times. Complete this exercise __________ times per day.  STRENGTH - Internal Rotators   Secure a rubber exercise band or tubing to a fixed object (table, pole) so that it is at the same height as your right / left elbow when you are standing or sitting on a firm surface.  Stand or sit so that the secured exercise band is at your right / left side.  Bend your elbow 90 degrees. Place a folded towel or small pillow under your right / left arm so that your elbow is a few inches away from your side.  Keeping the tension on the exercise band, pull it across your body, toward your stomach. Be sure to keep your body steady, so that the movement is coming only from your rotating shoulder.  Hold for __________ seconds. Release the tension in a controlled manner, as you return to the starting position. Repeat __________ times. Complete this exercise __________ times per day.  STRENGTH - Scapular Protractors, Standing   Stand arms length away from a wall. Place your hands on the wall, keeping your elbows straight.  Begin by dropping your shoulder blades down and toward your mid-back spine.  To strengthen your protractors, keep your shoulder blades down, but slide them forward on your rib cage. It will feel as if you are lifting the back of your rib cage away from the wall. This is a subtle motion and can be challenging to complete. Ask your caregiver for further instruction, if you are not sure you are doing the exercise correctly.  Hold for __________ seconds. Slowly return to the starting position, resting the muscles completely before starting the next repetition. Repeat __________ times. Complete this exercise __________ times per day. STRENGTH - Scapular Protractors, Supine  Lie on your back on a firm surface. Extend your right / left  arm straight into the air while holding a __________ weight in your hand.  Keeping your head and back in place, lift your shoulder off the floor.  Hold for __________ seconds. Slowly return to the starting position,  and allow your muscles to relax completely before starting the next repetition. Repeat __________ times. Complete this exercise __________ times per day. STRENGTH - Scapular Protractors, Quadruped  Get onto your hands and knees, with your shoulders directly over your hands (or as close as you can be, comfortably).  Keeping your elbows locked, lift the back of your rib cage up into your shoulder blades, so your mid-back rounds out. Keep your neck muscles relaxed.  Hold this position for __________ seconds. Slowly return to the starting position and allow your muscles to relax completely before starting the next repetition. Repeat __________ times. Complete this exercise __________ times per day.  STRENGTH - Scapular Retractors  Secure a rubber exercise band or tubing to a fixed object (table, pole), so that it is at the height of your shoulders when you are either standing, or sitting on a firm armless chair.  With a palm down grip, grasp an end of the band in each hand. Straighten your elbows and lift your hands straight in front of you, at shoulder height. Step back, away from the secured end of the band, until it becomes tense.  Squeezing your shoulder blades together, draw your elbows back toward your sides, as you bend them. Keep your upper arms lifted away from your body throughout the exercise.  Hold for __________ seconds. Slowly ease the tension on the band, as you reverse the directions and return to the starting position. Repeat __________ times. Complete this exercise __________ times per day. STRENGTH - Shoulder Extensors   Secure a rubber exercise band or tubing to a fixed object (table, pole) so that it is at the height of your shoulders when you are either  standing, or sitting on a firm armless chair.  With a thumbs-up grip, grasp an end of the band in each hand. Straighten your elbows and lift your hands straight in front of you, at shoulder height. Step back, away from the secured end of the band, until it becomes tense.  Squeezing your shoulder blades together, pull your hands down to the sides of your thighs. Do not allow your hands to go behind you.  Hold for __________ seconds. Slowly ease the tension on the band, as you reverse the directions and return to the starting position. Repeat __________ times. Complete this exercise __________ times per day.  STRENGTH - Scapular Retractors and External Rotators   Secure a rubber exercise band or tubing to a fixed object (table, pole) so that it is at the height as your shoulders, when you are either standing, or sitting on a firm armless chair.  With a palm down grip, grasp an end of the band in each hand. Bend your elbows 90 degrees and lift your elbows to shoulder height, at your sides. Step back, away from the secured end of the band, until it becomes tense.  Squeezing your shoulder blades together, rotate your shoulders so that your upper arms and elbows remain stationary, but your fists travel upward to head height.  Hold for __________ seconds. Slowly ease the tension on the band, as you reverse the directions and return to the starting position. Repeat __________ times. Complete this exercise __________ times per day.  STRENGTH - Scapular Retractors and External Rotators, Rowing   Secure a rubber exercise band or tubing to a fixed object (table, pole) so that it is at the height of your shoulders, when you are either standing, or sitting on a firm armless chair.  With a palm down  grip, grasp an end of the band in each hand. Straighten your elbows and lift your hands straight in front of you, at shoulder height. Step back, away from the secured end of the band, until it becomes  tense.  Step 1: Squeeze your shoulder blades together. Bending your elbows, draw your hands to your chest, as if you are rowing a boat. At the end of this motion, your hands and elbow should be at shoulder height and your elbows should be out to your sides.  Step 2: Rotate your shoulders, to raise your hands above your head. Your forearms should be vertical and your upper arms should be horizontal.  Hold for __________ seconds. Slowly ease the tension on the band, as you reverse the directions and return to the starting position. Repeat __________ times. Complete this exercise __________ times per day.  STRENGTH  Scapular Depressors  Find a sturdy chair without wheels, such as a dining room chair.  Keeping your feet on the floor, and your hands on the chair arms, lift your bottom up from the seat, and lock your elbows.  Keeping your elbows straight, allow gravity to pull your body weight down. Your shoulders will rise toward your ears.  Raise your body against gravity by drawing your shoulder blades down your back, shortening the distance between your shoulders and ears. Although your feet should always maintain contact with the floor, your feet should progressively support less body weight, as you get stronger.  Hold for __________ seconds. In a controlled and slow manner, lower your body weight to begin the next repetition. Repeat __________ times. Complete this exercise __________ times per day.  Document Released: 05/11/2005 Document Revised: 08/03/2011 Document Reviewed: 08/23/2008 [facility] Patient Information 2014 [place], [place].

## 2013-05-03 NOTE — Progress Notes (Signed)
Subjective:    Patient ID: Jay Davidson, male    DOB: 1956/02/18, 57 y.o.   MRN: [id number] This chart was scribed for Jay Sorenson, MD by Clydene Laming, ED Scribe. This patient was seen in room 1 and the patient's care was started at 5:25 PM. HPI HPI Comments: JULIOCESAR Davidson is a 57 y.o. male who presents to the [facility] complaining of a left shoulder injury that occurred a week ago. Pt states he hardly use the left arm now and it has worsened over the week.  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  Has worsened daily now to the point that he can hardly move his shoulder at all. Pt has tried some sulindac and heating pads for the last few days w/o any relief. He does not have a prior hx of shoulder injuries or problems.   He had a hip replacement completed in [facility].   Patient Active Problem List   Diagnosis Date Noted  . RIGHT BUNDLE BRANCH BLOCK 11/01/2009   Past Medical History  Diagnosis Date  . Allergy    Past Surgical History  Procedure Laterality Date  . Total hip arthroplasty     No Known Allergies Prior to Admission medications   Medication Sig Start Date End Date Taking? Authorizing Provider  Multiple Vitamins-Minerals (MULTIVITAMIN WITH MINERALS) tablet Take 1 tablet by mouth daily.   Yes Historical Provider, MD  aspirin 81 MG chewable tablet Chew 81 mg by mouth daily.    Historical Provider, MD   History   Social History  . Marital Status: Married    Spouse Name: N/A    Number of Children: N/A  . Years of Education: N/A   Occupational History  . Not on file.   Social History Main Topics  . Smoking status: Former Games developer  . Smokeless tobacco: Not on file  . Alcohol Use: Yes  . Drug Use: No  . Sexual Activity: Not on file   Other Topics Concern  . Not on file    Social History Narrative  . No narrative on file       Review of Systems  Constitutional: Negative for fever, chills, diaphoresis, activity change and appetite change.  Musculoskeletal: Positive for arthralgias and myalgias. Negative for gait problem and joint swelling.  Skin: Negative for color change, rash and wound.  Neurological: Positive for weakness. Negative for numbness.  Hematological: Negative for adenopathy. Does not bruise/bleed easily.      BP 122/70  Pulse 62  Temp(Src) 98.4 F (36.9 C) (Oral)  Resp 18  Ht 5\' 8"  (1.727 m)  Wt 179 lb (81.194 kg)  BMI 27.22 kg/m2  SpO2 97% Objective:   Physical Exam  Nursing note and vitals reviewed. Constitutional: He is oriented to person, place, and time. He appears well-developed and well-nourished.  HENT:  Head: Normocephalic and atraumatic.  Eyes: EOM are normal.  Neck: Normal range of motion.  Cardiovascular: Normal rate, regular rhythm, normal heart sounds and intact distal pulses.   Pulmonary/Chest: Effort normal and breath sounds normal. No respiratory distress.  Abdominal: Soft.  Musculoskeletal:       Right shoulder: Normal.       Left shoulder: He exhibits decreased range of motion, tenderness, bony tenderness, pain, spasm and decreased strength. He  exhibits no swelling, no effusion, no crepitus, no deformity, no laceration and normal pulse.       Left elbow: Normal.  L shoulder: No ttp over [facility] joint or coracoid process, + ttp over lateral achromion Norm abduction but severely reduced active ROM in abduction to 30 deg, flexion to 30 deg, and No extension, mod decreased internal rotation Normal passive ROM. 4+/5 internal and external rotation strength, 4/5 subscapularis strength on empty can test + Neer's and Hawkin's sign  Neurological: He is alert and oriented to person, place, and time.  Skin: Skin is warm and dry.  Psychiatric: He has a normal mood and affect. Judgment normal.      Assessment & Plan:   Rotator cuff (capsule) sprain, left, initial encounter Rec rest x 2-3d w/ freq icing. Start sched nsaid w/ prn pain med and muscle relaxant. RTW in 4d and start stretching then strengthening exercises. Will hold off on xray due to normal passive ROM but if no improvement rec RTC for repeat eval and xray and consider referral to PT vs ortho. Meds ordered this encounter  Medications  . HYDROcodone-acetaminophen (NORCO/VICODIN) 5-325 MG per tablet    Sig: Take 1 tablet by mouth every 6 (six) hours as needed for moderate pain.    Dispense:  30 tablet    Refill:  0  . cyclobenzaprine (FLEXERIL) 10 MG tablet    Sig: Take 1 tablet (10 mg total) by mouth 3 (three) times daily as needed for muscle spasms.    Dispense:  30 tablet    Refill:  0  . diclofenac (VOLTAREN) 75 MG EC tablet    Sig: Take 1 tablet (75 mg total) by mouth 2 (two) times daily. With food.    Dispense:  60 tablet    Refill:  1    I personally performed the services described in this documentation, which was scribed in my presence. The recorded information has been reviewed and considered, and addended by me as needed.  Jay Sorenson, MD MPH

## 2013-05-19 DIAGNOSIS — Z0271 Encounter for disability determination: Secondary | ICD-10-CM

## 2013-06-12 ENCOUNTER — Telehealth: Payer: Self-pay

## 2013-06-12 NOTE — Telephone Encounter (Signed)
Updated FMLA paperwork signed by Dr. Clelia CroftShaw and faxed to # provided. There is a copy in drawer for patient to pick up.

## 2013-06-30 ENCOUNTER — Encounter: Payer: Self-pay | Admitting: Family Medicine

## 2013-07-26 DIAGNOSIS — Z96649 Presence of unspecified artificial hip joint: Secondary | ICD-10-CM | POA: Insufficient documentation

## 2014-01-08 ENCOUNTER — Ambulatory Visit (INDEPENDENT_AMBULATORY_CARE_PROVIDER_SITE_OTHER): Payer: 59

## 2014-01-08 ENCOUNTER — Ambulatory Visit (INDEPENDENT_AMBULATORY_CARE_PROVIDER_SITE_OTHER): Payer: 59 | Admitting: Internal Medicine

## 2014-01-08 VITALS — BP 140/90 | HR 70 | Temp 99.0°F | Resp 16 | Ht 70.0 in | Wt 181.0 lb

## 2014-01-08 DIAGNOSIS — R109 Unspecified abdominal pain: Secondary | ICD-10-CM

## 2014-01-08 DIAGNOSIS — R197 Diarrhea, unspecified: Secondary | ICD-10-CM

## 2014-01-08 DIAGNOSIS — R110 Nausea: Secondary | ICD-10-CM

## 2014-01-08 LAB — POCT URINALYSIS DIPSTICK
Bilirubin, UA: NEGATIVE
Glucose, UA: NEGATIVE
LEUKOCYTES UA: NEGATIVE
Nitrite, UA: NEGATIVE
PROTEIN UA: NEGATIVE
Spec Grav, UA: 1.025
UROBILINOGEN UA: 0.2
pH, UA: 6.5

## 2014-01-08 LAB — POCT CBC
Granulocyte percent: 79.1 %G (ref 37–80)
HCT, POC: 45.9 % (ref 43.5–53.7)
Hemoglobin: 15 g/dL (ref 14.1–18.1)
LYMPH, POC: 1.6 (ref 0.6–3.4)
MCH: 29.9 pg (ref 27–31.2)
MCHC: 32.6 g/dL (ref 31.8–35.4)
MCV: 91.6 fL (ref 80–97)
MID (CBC): 0.5 (ref 0–0.9)
MPV: 7.7 fL (ref 0–99.8)
PLATELET COUNT, POC: 226 10*3/uL (ref 142–424)
POC Granulocyte: 8.1 — AB (ref 2–6.9)
POC LYMPH %: 15.9 % (ref 10–50)
POC MID %: 5 %M (ref 0–12)
RBC: 5.01 M/uL (ref 4.69–6.13)
RDW, POC: 13.8 %
WBC: 10.3 10*3/uL — AB (ref 4.6–10.2)

## 2014-01-08 LAB — POCT UA - MICROSCOPIC ONLY
Casts, Ur, LPF, POC: NEGATIVE
Crystals, Ur, HPF, POC: NEGATIVE
Mucus, UA: NEGATIVE
YEAST UA: NEGATIVE

## 2014-01-08 MED ORDER — KETOROLAC TROMETHAMINE 60 MG/2ML IM SOLN
60.0000 mg | Freq: Once | INTRAMUSCULAR | Status: AC
  Administered 2014-01-08: 60 mg via INTRAMUSCULAR

## 2014-01-08 MED ORDER — OXYCODONE-ACETAMINOPHEN 5-325 MG PO TABS: 1.0000 | ORAL_TABLET | Freq: Four times a day (QID) | ORAL | Status: DC | PRN

## 2014-01-08 NOTE — Progress Notes (Addendum)
Subjective:  This chart was scribed for Jay Siaobert Doolittle, MD by Jay Davidson, ED Scribe. This patient was seen in room 3 and the patient's care was started at 8:07 PM.   Patient ID: Jay Davidson, male    DOB: 08-Nov-1955, 58 y.o.   MRN: [id number]  HPI HPI Comments: Jay Davidson is a 58 y.o. male who presents to the [facility] complaining of flank pain.  He reports that the pain is a constant pain. He states that it feels like "something is driving their knee into me". He reports that his pain feels similar to a kidney stone he had a year ago. He states that he has nausea and diarrhea with no blood. He denies any urinary symptoms. He also denies any recent back injury.   Pt reports that he take tramadol for his past hip replacement.  Patient Active Problem List   Diagnosis Date Noted  . RIGHT BUNDLE BRANCH BLOCK 11/01/2009   Past Medical History  Diagnosis Date  . Allergy    Past Surgical History  Procedure Laterality Date  . Total hip arthroplasty     No Known Allergies Prior to Admission medications   Medication Sig Start Date End Date Taking? Authorizing Provider  aspirin 81 MG chewable tablet Chew 81 mg by mouth daily.   Yes Historical Provider, MD  Multiple Vitamins-Minerals (MULTIVITAMIN WITH MINERALS) tablet Take 1 tablet by mouth daily.   Yes Historical Provider, MD  traMADol (ULTRAM) 50 MG tablet Take 50 mg by mouth every 6 (six) hours as needed.  Prn hip pain s/p repl R Yes Historical Provider, MD   History   Social History  . Marital Status: Married    Spouse Name: Jay Davidson    Number of Children: Jay Davidson  . Years of Education: Jay Davidson   Occupational History  . Not on file.   Social History Main Topics  . Smoking status: Former Games developermoker  . Smokeless tobacco: Not on file  . Alcohol Use: Yes  . Drug Use: No  . Sexual Activity: Not on file   Other Topics Concern  . Not on file   Social History Narrative  . No narrative on file     Review of  Systems  Genitourinary: Positive for flank pain. Negative for dysuria.   Filed Vitals:   01/08/14 1955  BP: 140/90  Pulse: 70  Temp: 99 F (37.2 C)  TempSrc: Oral  Resp: 16  Height: 5\' 10"  (1.778 m)  Weight: 181 lb (82.101 kg)  SpO2: 97%       Objective:   Physical Exam  Nursing note and vitals reviewed. Constitutional: He is oriented to person, place, and time. He appears well-developed and well-nourished.  Obviously uncomfortable  HENT:  Head: Normocephalic and atraumatic.  Neck: Neck supple.  Cardiovascular: Normal rate.   Pulmonary/Chest: Effort normal. No respiratory distress.  Abdominal: He exhibits no mass. There is no tenderness.  No CVA tenderness to percussion.   Musculoskeletal: Normal range of motion.  Neurological: He is alert and oriented to person, place, and time.  Skin: Skin is warm and dry.  BP 140/90  Pulse 70  Temp(Src) 99 F (37.2 C) (Oral)  Resp 16  Ht 5\' 10"  (1.778 m)  Wt 181 lb (82.101 kg)  BMI 25.97 kg/m2  SpO2 97% [facility] reading (PRIMARY) by  Dr. Doolittle=stones vs phelboliths L pelvis       Gallstone RUQ   Results for orders placed in visit on 01/08/14  POCT URINALYSIS DIPSTICK      Result Value Ref Range   Color, UA yellow     Clarity, UA clear     Glucose, UA neg     Bilirubin, UA neg     Ketones, UA trace     Spec Grav, UA 1.025     Blood, UA trace     pH, UA 6.5     Protein, UA neg     Urobilinogen, UA 0.2     Nitrite, UA neg     Leukocytes, UA Negative    POCT UA - MICROSCOPIC ONLY      Result Value Ref Range   WBC, Ur, HPF, POC 0-1     RBC, urine, microscopic 1-3     Bacteria, U Microscopic trace     Mucus, UA neg     Epithelial cells, urine per micros 0-2     Crystals, Ur, HPF, POC neg     Casts, Ur, LPF, POC neg     Yeast, UA neg    POCT CBC      Result Value Ref Range   WBC 10.3 (*) 4.6 - 10.2 K/uL   Lymph, poc 1.6  0.6 - 3.4   POC LYMPH PERCENT 15.9  10 - 50 %L   MID (cbc) 0.5  0 - 0.9   POC MID % 5.0  0 -  12 %M   POC Granulocyte 8.1 (*) 2 - 6.9   Granulocyte percent 79.1  37 - 80 %G   RBC 5.01  4.69 - 6.13 M/uL   Hemoglobin 15.0  14.1 - 18.1 g/dL   HCT, POC 78.2  95.6 - 53.7 %   MCV 91.6  80 - 97 fL   MCH, POC 29.9  27 - 31.2 pg   MCHC 32.6  31.8 - 35.4 g/dL   RDW, POC 21.3     Platelet Count, POC 226  142 - 424 K/uL   MPV 7.7  0 - 99.8 fL           Assessment & Plan:  I have completed the patient encounter in its entirety as documented by the scribe, with editing by me where necessary. Robert P. Merla Riches, M.D.  Flank pain - ? Stone///height discussed with him that we don't have a definite diagnosis and close followup is necessary Toradol 60 Push fluids Percocet 5/325 every 6 hours as needed CT abdomen and pelvis tomorrow if not responding/passing stone

## 2014-01-09 ENCOUNTER — Telehealth: Payer: Self-pay

## 2014-01-09 DIAGNOSIS — R1013 Epigastric pain: Secondary | ICD-10-CM

## 2014-01-09 LAB — COMPREHENSIVE METABOLIC PANEL
ALBUMIN: 4.9 g/dL (ref 3.5–5.2)
ALT: 19 U/L (ref 0–53)
AST: 23 U/L (ref 0–37)
Alkaline Phosphatase: 62 U/L (ref 39–117)
BILIRUBIN TOTAL: 1.4 mg/dL — AB (ref 0.2–1.2)
BUN: 14 mg/dL (ref 6–23)
CO2: 29 meq/L (ref 19–32)
Calcium: 9.9 mg/dL (ref 8.4–10.5)
Chloride: 103 mEq/L (ref 96–112)
Creat: 1.39 mg/dL — ABNORMAL HIGH (ref 0.50–1.35)
GLUCOSE: 120 mg/dL — AB (ref 70–99)
POTASSIUM: 5.1 meq/L (ref 3.5–5.3)
SODIUM: 139 meq/L (ref 135–145)
TOTAL PROTEIN: 7.2 g/dL (ref 6.0–8.3)

## 2014-01-09 NOTE — Telephone Encounter (Signed)
Pt called about his xray report. I let him know that it was negative.

## 2014-01-09 NOTE — Telephone Encounter (Signed)
Call 1st thing wed---if not well we need to order CT abd and pelvis

## 2014-01-12 ENCOUNTER — Telehealth: Payer: Self-pay | Admitting: *Deleted

## 2014-01-12 MED ORDER — OXYCODONE-ACETAMINOPHEN 5-325 MG PO TABS: 1.0000 | ORAL_TABLET | Freq: Four times a day (QID) | ORAL | Status: DC | PRN

## 2014-01-12 NOTE — Telephone Encounter (Signed)
Pt is still having a lot of pain. He wants to go for CT. I have placed order.

## 2014-01-12 NOTE — Telephone Encounter (Signed)
Pt states he would like to wait until Monday to have the scan done due to his work schedule. He would like to see about getting some pain medication called in.  Please advise. [facility].

## 2014-01-12 NOTE — Telephone Encounter (Signed)
Left message on machine to call back  

## 2014-01-12 NOTE — Telephone Encounter (Signed)
Pt has been scheduled for a ct abd pelvis Monday at 1045 and [facility] [address]. Pt will need to stop by the office to pick up contrast. Pt will need to drink one at 9 am and one at 10am on Monday.  I have left these in the pick up drawer.  Pt advised of directions- and would like to pick up pain medication refill with contrast.

## 2014-01-15 ENCOUNTER — Telehealth: Payer: Self-pay | Admitting: Family Medicine

## 2014-01-15 ENCOUNTER — Ambulatory Visit
Admission: RE | Admit: 2014-01-15 | Discharge: 2014-01-15 | Disposition: A | Discharge status: Home / Self Care | Payer: 59 | Source: Ambulatory Visit | Attending: Internal Medicine | Admitting: Internal Medicine

## 2014-01-15 ENCOUNTER — Other Ambulatory Visit: Payer: Self-pay | Admitting: *Deleted

## 2014-01-15 DIAGNOSIS — R109 Unspecified abdominal pain: Secondary | ICD-10-CM

## 2014-01-15 DIAGNOSIS — N200 Calculus of kidney: Secondary | ICD-10-CM

## 2014-01-15 DIAGNOSIS — R1013 Epigastric pain: Secondary | ICD-10-CM

## 2014-01-15 NOTE — Telephone Encounter (Signed)
Called patient with CT report per Dr. Merla Riches. See results. We will refer him to [facility] this week for appt. Patient notified and voiced understanding.

## 2014-01-22 ENCOUNTER — Other Ambulatory Visit: Payer: Self-pay | Admitting: Urology

## 2014-01-30 ENCOUNTER — Encounter (HOSPITAL_BASED_OUTPATIENT_CLINIC_OR_DEPARTMENT_OTHER): Payer: Self-pay | Admitting: *Deleted

## 2014-01-31 ENCOUNTER — Encounter (HOSPITAL_BASED_OUTPATIENT_CLINIC_OR_DEPARTMENT_OTHER): Payer: Self-pay | Admitting: *Deleted

## 2014-01-31 NOTE — Progress Notes (Signed)
NPO AFTER MN. ARRIVE AT 0600. NEEDS KUB. CURRENT LAB RESULTS IN CHART AND EPIC. MAY TAKE PAIN RX IF NEEDED AM DOS W/ SIPS OF WATER.

## 2014-02-01 NOTE — Anesthesia Preprocedure Evaluation (Addendum)
Anesthesia Evaluation  Patient identified by MRN, date of birth, ID band Patient awake    Reviewed: Allergy & Precautions, H&P , NPO status , Patient's Chart, lab work & pertinent test results  History of Anesthesia Complications Negative for: history of anesthetic complications  Airway Mallampati: II TM Distance: >3 FB Neck ROM: Full    Dental  (+) Poor Dentition, Chipped, Missing,    Pulmonary former smoker,  breath sounds clear to auscultation  Pulmonary exam normal       Cardiovascular negative cardio ROS  + dysrhythmias (RBBB) Rhythm:Regular Rate:Normal  06-29-09 2 D ECHO  - Left ventricle: The cavity size was normal. Wall thickness was    normal. Systolic function was normal. The estimated ejection    fraction was in the range of 55% to 60%. Wall motion was normal;   Neuro/Psych negative neurological ROS  negative psych ROS   GI/Hepatic negative GI ROS, Neg liver ROS,   Endo/Other  negative endocrine ROS  Renal/GU negative Renal ROS  negative genitourinary   Musculoskeletal negative musculoskeletal ROS (+) Arthritis -, Osteoarthritis,  Right THR   Abdominal   Peds negative pediatric ROS (+)  Hematology negative hematology ROS (+)   Anesthesia Other Findings   Reproductive/Obstetrics negative OB ROS                      Anesthesia Physical Anesthesia Plan  ASA: II  Anesthesia Plan: General   Post-op Pain Management:    Induction: Intravenous  Airway Management Planned: LMA  Additional Equipment:   Intra-op Plan:   Post-operative Plan: Extubation in OR  Informed Consent: I have reviewed the patients History and Physical, chart, labs and discussed the procedure including the risks, benefits and alternatives for the proposed anesthesia with the patient or authorized representative who has indicated his/her understanding and acceptance.   Dental advisory given  Plan Discussed  with: CRNA  Anesthesia Plan Comments:         Anesthesia Quick Evaluation

## 2014-02-02 ENCOUNTER — Encounter (HOSPITAL_BASED_OUTPATIENT_CLINIC_OR_DEPARTMENT_OTHER): Payer: 59 | Admitting: Anesthesiology

## 2014-02-02 ENCOUNTER — Encounter (HOSPITAL_BASED_OUTPATIENT_CLINIC_OR_DEPARTMENT_OTHER)
Admission: RE | Disposition: A | Discharge status: Home / Self Care | Payer: Self-pay | Source: Ambulatory Visit | Attending: Urology

## 2014-02-02 ENCOUNTER — Ambulatory Visit (HOSPITAL_COMMUNITY): Payer: 59

## 2014-02-02 ENCOUNTER — Ambulatory Visit (HOSPITAL_BASED_OUTPATIENT_CLINIC_OR_DEPARTMENT_OTHER): Payer: 59 | Admitting: Anesthesiology

## 2014-02-02 ENCOUNTER — Encounter (HOSPITAL_BASED_OUTPATIENT_CLINIC_OR_DEPARTMENT_OTHER): Payer: Self-pay

## 2014-02-02 ENCOUNTER — Ambulatory Visit (HOSPITAL_BASED_OUTPATIENT_CLINIC_OR_DEPARTMENT_OTHER)
Admission: RE | Admit: 2014-02-02 | Discharge: 2014-02-02 | Disposition: A | Discharge status: Home / Self Care | Payer: 59 | Source: Ambulatory Visit | Attending: Urology | Admitting: Urology

## 2014-02-02 DIAGNOSIS — M1990 Unspecified osteoarthritis, unspecified site: Secondary | ICD-10-CM | POA: Diagnosis not present

## 2014-02-02 DIAGNOSIS — I4891 Unspecified atrial fibrillation: Secondary | ICD-10-CM | POA: Insufficient documentation

## 2014-02-02 DIAGNOSIS — Z87891 Personal history of nicotine dependence: Secondary | ICD-10-CM | POA: Diagnosis not present

## 2014-02-02 DIAGNOSIS — K5730 Diverticulosis of large intestine without perforation or abscess without bleeding: Secondary | ICD-10-CM | POA: Diagnosis not present

## 2014-02-02 DIAGNOSIS — Z79899 Other long term (current) drug therapy: Secondary | ICD-10-CM | POA: Insufficient documentation

## 2014-02-02 DIAGNOSIS — N201 Calculus of ureter: Secondary | ICD-10-CM

## 2014-02-02 HISTORY — DX: Personal history of other diseases of the digestive system: Z87.19

## 2014-02-02 HISTORY — DX: Calculus of ureter: N20.1

## 2014-02-02 HISTORY — DX: Other specified personal risk factors, not elsewhere classified: Z91.89

## 2014-02-02 HISTORY — PX: HOLMIUM LASER APPLICATION: SHX5852

## 2014-02-02 HISTORY — DX: Unspecified right bundle-branch block: I45.10

## 2014-02-02 HISTORY — DX: Calculus of gallbladder without cholecystitis without obstruction: K80.20

## 2014-02-02 HISTORY — DX: Unspecified osteoarthritis, unspecified site: M19.90

## 2014-02-02 HISTORY — PX: CYSTOSCOPY WITH URETEROSCOPY AND STENT PLACEMENT: SHX6377

## 2014-02-02 SURGERY — CYSTOURETEROSCOPY, WITH STENT INSERTION
Anesthesia: General | Site: Ureter | Laterality: Left

## 2014-02-02 MED ORDER — PROMETHAZINE HCL 25 MG/ML IJ SOLN
6.2500 mg | INTRAMUSCULAR | Status: DC | PRN
  Filled 2014-02-02: qty 1

## 2014-02-02 MED ORDER — LIDOCAINE HCL (CARDIAC) 20 MG/ML IV SOLN
INTRAVENOUS | Status: DC | PRN
  Administered 2014-02-02: 80 mg via INTRAVENOUS

## 2014-02-02 MED ORDER — FENTANYL CITRATE 0.05 MG/ML IJ SOLN
INTRAMUSCULAR | Status: AC
  Filled 2014-02-02: qty 4

## 2014-02-02 MED ORDER — PHENAZOPYRIDINE HCL 100 MG PO TABS
ORAL_TABLET | ORAL | Status: AC
  Filled 2014-02-02: qty 2

## 2014-02-02 MED ORDER — CIPROFLOXACIN IN D5W 200 MG/100ML IV SOLN
INTRAVENOUS | Status: AC
  Filled 2014-02-02: qty 100

## 2014-02-02 MED ORDER — MIDAZOLAM HCL 2 MG/2ML IJ SOLN
INTRAMUSCULAR | Status: AC
  Filled 2014-02-02: qty 2

## 2014-02-02 MED ORDER — BELLADONNA ALKALOIDS-OPIUM 16.2-60 MG RE SUPP
RECTAL | Status: DC | PRN
  Administered 2014-02-02: 1 via RECTAL

## 2014-02-02 MED ORDER — ACETAMINOPHEN 325 MG PO TABS
325.0000 mg | ORAL_TABLET | ORAL | Status: DC | PRN
  Filled 2014-02-02: qty 2

## 2014-02-02 MED ORDER — MEPERIDINE HCL 25 MG/ML IJ SOLN
6.2500 mg | INTRAMUSCULAR | Status: DC | PRN
  Filled 2014-02-02: qty 1

## 2014-02-02 MED ORDER — OXYCODONE-ACETAMINOPHEN 5-325 MG PO TABS
ORAL_TABLET | ORAL | Status: AC
  Filled 2014-02-02: qty 1

## 2014-02-02 MED ORDER — PHENAZOPYRIDINE HCL 200 MG PO TABS
200.0000 mg | ORAL_TABLET | Freq: Once | ORAL | Status: AC
  Administered 2014-02-02: 200 mg via ORAL
  Filled 2014-02-02: qty 1

## 2014-02-02 MED ORDER — LIDOCAINE HCL 2 % EX GEL
CUTANEOUS | Status: DC | PRN
  Administered 2014-02-02: 1 via URETHRAL

## 2014-02-02 MED ORDER — CIPROFLOXACIN IN D5W 200 MG/100ML IV SOLN
200.0000 mg | INTRAVENOUS | Status: AC
  Administered 2014-02-02: 200 mg via INTRAVENOUS
  Filled 2014-02-02: qty 100

## 2014-02-02 MED ORDER — FENTANYL CITRATE 0.05 MG/ML IJ SOLN
INTRAMUSCULAR | Status: DC | PRN
  Administered 2014-02-02: 25 ug via INTRAVENOUS
  Administered 2014-02-02: 75 ug via INTRAVENOUS

## 2014-02-02 MED ORDER — OXYCODONE HCL 5 MG PO TABS
ORAL_TABLET | ORAL | Status: AC
  Filled 2014-02-02: qty 1

## 2014-02-02 MED ORDER — FENTANYL CITRATE 0.05 MG/ML IJ SOLN
25.0000 ug | INTRAMUSCULAR | Status: DC | PRN
Start: 2014-02-02 — End: 2014-02-02
  Filled 2014-02-02: qty 1

## 2014-02-02 MED ORDER — ACETAMINOPHEN 160 MG/5ML PO SOLN
325.0000 mg | ORAL | Status: DC | PRN
  Filled 2014-02-02: qty 20.3

## 2014-02-02 MED ORDER — OXYCODONE HCL 5 MG PO TABS
5.0000 mg | ORAL_TABLET | Freq: Once | ORAL | Status: AC
  Administered 2014-02-02: 5 mg via ORAL
  Filled 2014-02-02: qty 1

## 2014-02-02 MED ORDER — DEXAMETHASONE SODIUM PHOSPHATE 10 MG/ML IJ SOLN
INTRAMUSCULAR | Status: DC | PRN
  Administered 2014-02-02: 10 mg via INTRAVENOUS

## 2014-02-02 MED ORDER — KETOROLAC TROMETHAMINE 30 MG/ML IJ SOLN
INTRAMUSCULAR | Status: DC | PRN
  Administered 2014-02-02: 30 mg via INTRAVENOUS

## 2014-02-02 MED ORDER — IOHEXOL 350 MG/ML SOLN
INTRAVENOUS | Status: DC | PRN
  Administered 2014-02-02: 2 mL via URETHRAL

## 2014-02-02 MED ORDER — EPHEDRINE SULFATE 50 MG/ML IJ SOLN
INTRAMUSCULAR | Status: DC | PRN
  Administered 2014-02-02: 10 mg via INTRAVENOUS

## 2014-02-02 MED ORDER — SODIUM CHLORIDE 0.9 % IR SOLN
Status: DC | PRN
  Administered 2014-02-02: 4000 mL

## 2014-02-02 MED ORDER — BELLADONNA ALKALOIDS-OPIUM 16.2-60 MG RE SUPP
RECTAL | Status: AC
  Filled 2014-02-02: qty 1

## 2014-02-02 MED ORDER — PHENAZOPYRIDINE HCL 200 MG PO TABS: 200.0000 mg | ORAL_TABLET | Freq: Three times a day (TID) | ORAL | Status: DC | PRN

## 2014-02-02 MED ORDER — PROPOFOL INFUSION 10 MG/ML OPTIME
INTRAVENOUS | Status: DC | PRN
  Administered 2014-02-02: 200 mL via INTRAVENOUS

## 2014-02-02 MED ORDER — LACTATED RINGERS IV SOLN
INTRAVENOUS | Status: DC
  Administered 2014-02-02: 06:00:00 via INTRAVENOUS
  Filled 2014-02-02: qty 1000

## 2014-02-02 MED ORDER — OXYCODONE-ACETAMINOPHEN 5-325 MG PO TABS
1.0000 | ORAL_TABLET | Freq: Once | ORAL | Status: AC
  Administered 2014-02-02: 1 via ORAL
  Filled 2014-02-02: qty 1

## 2014-02-02 MED ORDER — OXYCODONE-ACETAMINOPHEN 10-325 MG PO TABS: 1.0000 | ORAL_TABLET | ORAL | Status: DC | PRN

## 2014-02-02 MED ORDER — MIDAZOLAM HCL 5 MG/5ML IJ SOLN
INTRAMUSCULAR | Status: DC | PRN
  Administered 2014-02-02: 2 mg via INTRAVENOUS

## 2014-02-02 SURGICAL SUPPLY — 46 items
ADAPTER CATH URET PLST 4-6FR (CATHETERS) IMPLANT
ADPR CATH URET STRL DISP 4-6FR (CATHETERS)
BAG DRAIN URO-CYSTO SKYTR STRL (DRAIN) ×2 IMPLANT
BAG DRN UROCATH (DRAIN) ×1
BASKET LASER NITINOL 1.9FR (BASKET) IMPLANT
BASKET STNLS GEMINI 4WIRE 3FR (BASKET) IMPLANT
BASKET ZERO TIP NITINOL 2.4FR (BASKET) ×1 IMPLANT
BSKT STON RTRVL 120 1.9FR (BASKET)
BSKT STON RTRVL GEM 120X11 3FR (BASKET)
BSKT STON RTRVL ZERO TP 2.4FR (BASKET) ×1
CANISTER SUCT LVC 12 LTR MEDI- (MISCELLANEOUS) ×1 IMPLANT
CATH INTERMIT  6FR 70CM (CATHETERS) ×1 IMPLANT
CATH URET 5FR 28IN CONE TIP (BALLOONS)
CATH URET 5FR 70CM CONE TIP (BALLOONS) IMPLANT
CLOTH BEACON ORANGE TIMEOUT ST (SAFETY) ×2 IMPLANT
DRAPE CAMERA CLOSED 9X96 (DRAPES) ×2 IMPLANT
ELECT REM PT RETURN 9FT ADLT (ELECTROSURGICAL)
ELECTRODE REM PT RTRN 9FT ADLT (ELECTROSURGICAL) IMPLANT
FIBER LASER FLEXIVA 200 (UROLOGICAL SUPPLIES) ×1 IMPLANT
FIBER LASER FLEXIVA 365 (UROLOGICAL SUPPLIES) IMPLANT
FIBER LASER FLEXIVA 550 (UROLOGICAL SUPPLIES) IMPLANT
GLOVE BIO SURGEON STRL SZ8 (GLOVE) ×2 IMPLANT
GLOVE BIOGEL PI IND STRL 7.5 (GLOVE) IMPLANT
GLOVE BIOGEL PI INDICATOR 7.5 (GLOVE) ×2
GOWN PREVENTION PLUS LG XLONG (DISPOSABLE) ×1 IMPLANT
GOWN STRL REIN XL XLG (GOWN DISPOSABLE) ×1 IMPLANT
GOWN STRL REUS W/TWL XL LVL3 (GOWN DISPOSABLE) ×2 IMPLANT
GUIDEWIRE 0.038 PTFE COATED (WIRE) IMPLANT
GUIDEWIRE ANG ZIPWIRE 038X150 (WIRE) IMPLANT
GUIDEWIRE STR DUAL SENSOR (WIRE) ×2 IMPLANT
IV NS 1000ML (IV SOLUTION) ×2
IV NS 1000ML BAXH (IV SOLUTION) IMPLANT
IV NS IRRIG 3000ML ARTHROMATIC (IV SOLUTION) ×3 IMPLANT
KIT BALLIN UROMAX 15FX10 (LABEL) IMPLANT
KIT BALLN UROMAX 15FX4 (MISCELLANEOUS) IMPLANT
KIT BALLN UROMAX 26 75X4 (MISCELLANEOUS)
NS IRRIG 1000ML POUR BTL (IV SOLUTION) ×1 IMPLANT
PACK CYSTOSCOPY (CUSTOM PROCEDURE TRAY) ×2 IMPLANT
SET HIGH PRES BAL DIL (LABEL)
SHEATH ACCESS URETERAL 38CM (SHEATH) IMPLANT
SHEATH ACCESS URETERAL 54CM (SHEATH) IMPLANT
SHEATH URET ACCESS 12FR/35CM (UROLOGICAL SUPPLIES) ×1 IMPLANT
SHEATH URET ACCESS 12FR/55CM (UROLOGICAL SUPPLIES) IMPLANT
STENT PERCUFLEX 4.8FRX24 (STENTS) ×1 IMPLANT
SYRINGE IRR TOOMEY STRL 70CC (SYRINGE) ×1 IMPLANT
WATER STERILE IRR 3000ML UROMA (IV SOLUTION) IMPLANT

## 2014-02-02 NOTE — Transfer of Care (Signed)
Immediate Anesthesia Transfer of Care Note  Patient: Jay Davidson  Procedure(s) Performed: Procedure(s): CYSTOSCOPY WITH LEFT RETRGRADE, URETEROSCOPY AND STENT PLACEMENT (Left) HOLMIUM LASER APPLICATION (Left)  Patient Location: PACU  Anesthesia Type:General  Level of Consciousness: awake, alert , oriented and patient cooperative  Airway & Oxygen Therapy: Patient Spontanous Breathing and Patient connected to nasal cannula oxygen  Post-op Assessment: Report given to PACU RN and Post -op Vital signs reviewed and stable  Post vital signs: Reviewed and stable  Complications: No apparent anesthesia complications

## 2014-02-02 NOTE — H&P (Signed)
History of Present Illness Nephrolithiasis: A CT scan done on 11/30/12 revealed a 6 mm stone in the mid left ureter with Hounsfield units of ~1000. No right or left renal calculi were noted. He reports he passed a small stone when he was 58 years old. Both his brother and son have had kidney stones. He was scheduled for ureteroscopic treatment of his stone but canceled the surgery.     Interval history: A CT scan done on 01/15/14 revealed no right or left renal calculi however a 6.3 mm stone was seen in the distal left ureter with Hounsfield units of 1000. He said about 2 weeks ago he began having left lower quadrant pain. It's fairly severe. It is not relieved by positional change. It has not been associated with any hematuria or change in his voiding pattern. There is no radiation in the testicles.      Past Medical History Problems  1. History of Atrial fibrillation (427.31) 2. History of Cholelithiasis, unspecified acuity, unspecified acuity 3. History of Diverticulosis (562.10)  Surgical History Problems  1. History of Elbow Surgery 2. History of Knee Surgery 3. History of Total Hip Replacement  Current Meds 1. TraMADol HCl TABS;  Therapy: (Recorded:11Jul2014) to Recorded  Allergies Medication  1. No Known Drug Allergies  Family History Problems  1. Family history of Chronic Obstructive Pulmonary Disease 2. Family history of Uterine Cancer (V16.49) : Mother  Family and social history were reviewed. There are no changes   Social History Problems  1. Alcohol Use 2. Caffeine Use 3. Former smoker (V15.82)   [id number] years quit 1991 4. Marital History - Currently Married  Review of Systems Genitourinary, constitutional, skin, eye, otolaryngeal, hematologic/lymphatic, cardiovascular, pulmonary, endocrine, musculoskeletal, gastrointestinal, neurological and psychiatric system(s) were reviewed and pertinent findings if present are noted.    Vitals Height: 5 ft 8  in Weight: 175 lb  BMI Calculated: 26.61 BSA Calculated: 1.93 Blood Pressure: 125 / 70 Temperature: 98.2 F Heart Rate: 75  Physical Exam Constitutional: Well nourished and well developed . No acute distress.  ENT:. The ears and nose are normal in appearance.  Neck: The appearance of the neck is normal and no neck mass is present.  Pulmonary: No respiratory distress and normal respiratory rhythm and effort.  Cardiovascular: Heart rate and rhythm are normal . No peripheral edema.  Abdomen: The abdomen is soft and nontender. No masses are palpated. No CVA tenderness. No hernias are palpable. No hepatosplenomegaly noted.  Lymphatics: The femoral and inguinal nodes are not enlarged or tender.  Skin: Normal skin turgor, no visible rash and no visible skin lesions.  Neuro/Psych:. Mood and affect are appropriate.   Assessment Assessed   We discussed the management of urinary stones. These options include observation, ureteroscopy, shockwave lithotripsy, and PCNL. We discussed which options are relevant to these particular stones. We discussed the natural history of stones as well as the complications of untreated stones and the impact on quality of life without treatment as well as with each of the above listed treatments. We also discussed the efficacy of each treatment in its ability to clear the stone burden. With any of these management options I discussed the signs and symptoms of infection and the need for emergent treatment should these be experienced. For each option we discussed the ability of each procedure to clear the patient of their stone burden.    For observation I described the risks which include but are not limited to silent renal damage, life-threatening infection,  need for emergent surgery, failure to pass stone, and pain.    For ureteroscopy I described the risks which include heart attack, stroke, pulmonary embolus, death, bleeding, infection, damage to contiguous  structures, positioning injury, ureteral stricture, ureteral avulsion, ureteral injury, need for ureteral stent, inability to perform ureteroscopy, need for an interval procedure, inability to clear stone burden, stent discomfort and pain.    For shockwave lithotripsy I described the risks which include arrhythmia, kidney contusion, kidney hemorrhage, need for transfusion, long-term risk of diabetes or hypertension, back discomfort, flank ecchymosis, flank abrasion, inability to break up stone, inability to pass stone fragments, Steinstrasse, infection associated with obstructing stones, need for different surgical procedure, need for repeat shockwave lithotripsy, and death.    We have discussed proceeding with ureteroscopy previously. I went over that with him again today. We discussed the procedure in detail including its risks and, patient's, the probability of success, the need for a stent afterwards, the outpatient nature of the procedure as well as the anticipated postoperative course. He understands and elected to proceed.   Plan  He will be scheduled for left ureteroscopy and laser lithotripsy of his left distal ureteral stone. I've given him a prescription for oxycodone today.

## 2014-02-02 NOTE — OR Nursing (Signed)
Left ureteral stone taking by Dr. Ottelin. 

## 2014-02-02 NOTE — Discharge Instructions (Signed)
Post stone removal/stent placement surgery instructions   Definitions:  Ureter: The duct that transports urine from the kidney to the bladder. Stent: A plastic hollow tube that is placed into the ureter, from the kidney to the bladder to prevent the ureter from swelling shut.  General instructions:  Despite the fact that no skin incisions were used, the area around the ureter and bladder is raw and irritated. The stent is a foreign body which will further irritate the bladder wall. This irritation is manifested by increased frequency of urination, both day and night, and by an increase in the urge to urinate. In some, the urge to urinate is present almost always. Sometimes the urge is strong enough that you may not be able to stop your self from urinating. The only real cure is to remove the stent and then give time for the bladder wall to heal which can't be done until the danger of the ureter swelling shut has passed. (This varies from 2-21 days).  You may see some blood in your urine while the stent is in place and a few days afterward. Do not be alarmed, even if the urine is clear for a while. Get off your feet and drink lots of fluids until clearing occurs. If you start to pass clots or don't improve, call us.  If you have a string coming from your urethra:  The stent string is attached to your ureteral stent.  Do not pull on thisIf you have a string coming from your urethra:  The stent string is attached to your ureteral stent.  Do not pull on this.  Diet:  You may return to your normal diet immediately. Because of the raw surface of your bladder, alcohol, spicy foods, foods high in acid and drinks with caffeine may cause irritation or frequency and should be used in moderation. To keep your urine flowing freely and avoid constipation, drink plenty of fluids during the day (8-10 glasses). Tip: Avoid cranberry juice because it is very acidic.  Activity:  Your physical activity doesn't need  to be restricted. However, if you are very active, you may see some blood in the urine. We suggest that you reduce your activity under the circumstances until the bleeding has stopped.  Bowels:  It is important to keep your bowels regular during the postoperative period. Straining with bowel movements can cause bleeding. A bowel movement every other day is reasonable. Use a mild laxative if needed, such as milk of magnesia 2-3 tablespoons, or 2 Dulcolax tablets. Call if you continue to have problems. If you had been taking narcotics for pain, before, during or after your surgery, you may be constipated. Take a laxative if necessary.     Medication:  You should resume your pre-surgery medications unless told not to. DO NOT RESUME YOUR ASPIRIN, or any other medicines like ibuprofen, motrin, excedrin, advil, aleve, vitamin E, fish oil as these can all cause bleeding x 7 days. In addition you may be given an antibiotic to prevent or treat infection. Antibiotics are not always necessary. All medication should be taken as prescribed until the bottles are finished unless you are having an unusual reaction to one of the drugs.  Problems you should report to [place]:  a. Fever greater than 101F. b. Heavy bleeding, or clots (see notes above about blood in urine). c. Inability to urinate. d. Drug reactions (hives, rash, nausea, vomiting, diarrhea). e. Severe burning or pain with urination that is not improving.  Followup:  You will need a followup appointment to monitor your progress in most cases. Please call the office for this appointment when you get home if your appointment has not already been scheduled. Usually the first appointment will be about 5-14 days after your surgery and if you have a stent in place it will likely be removed at that time.   CYSTOSCOPY HOME CARE INSTRUCTIONS  Activity: Rest for the remainder of the day.  Do not drive or operate equipment today.  You may resume normal  activities in one to two days as instructed by your physician.   Meals: Drink plenty of liquids and eat light foods such as gelatin or soup this evening.  You may return to a normal meal plan tomorrow.  Return to Work: You may return to work in one to two days or as instructed by your physician.  Special Instructions / Symptoms: Call your physician if any of these symptoms occur:   -persistent or heavy bleeding  -bleeding which continues after first few urination  -large blood clots that are difficult to pass  -urine stream diminishes or stops completely  -fever equal to or higher than 101 degrees Farenheit.  -cloudy urine with a strong, foul odor  -severe pain  Females should always wipe from front to back after elimination.  You may feel some burning pain when you urinate.  This should disappear with time.  Applying moist heat to the lower abdomen or a hot tub bath may help relieve the pain. \  Follow-Up / Date of Return Visit to Your Physician:  *** Call for an appointment to arrange follow-up.  Patient Signature:  ________________________________________________________  Nurse's Signature:  ________________________________________________________     Post Anesthesia Home Care Instructions  Activity: Get plenty of rest for the remainder of the day. A responsible adult should stay with you for 24 hours following the procedure.  For the next 24 hours, DO NOT: -Drive a car -Advertising copywriter -Drink alcoholic beverages -Take any medication unless instructed by your physician -Make any legal decisions or sign important papers.  Meals: Start with liquid foods such as gelatin or soup. Progress to regular foods as tolerated. Avoid greasy, spicy, heavy foods. If nausea and/or vomiting occur, drink only clear liquids until the nausea and/or vomiting subsides. Call your physician if vomiting continues.  Special Instructions/Symptoms: Your throat may feel dry or sore from the  anesthesia or the breathing tube placed in your throat during surgery. If this causes discomfort, gargle with warm salt water. The discomfort should disappear within 24 hours.

## 2014-02-02 NOTE — Anesthesia Procedure Notes (Signed)
Procedure Name: LMA Insertion Date/Time: 02/02/2014 7:36 AM Performed by: Tyrone Nine Pre-anesthesia Checklist: Patient identified, Timeout performed, Emergency Drugs available, Suction available and Patient being monitored Patient Re-evaluated:Patient Re-evaluated prior to inductionOxygen Delivery Method: Circle system utilized Preoxygenation: Pre-oxygenation with 100% oxygen Intubation Type: IV induction Ventilation: Mask ventilation without difficulty LMA: LMA inserted LMA Size: 4.0 Number of attempts: 1 Airway Equipment and Method: Bite block Placement Confirmation: breath sounds checked- equal and bilateral Tube secured with: Tape

## 2014-02-02 NOTE — Op Note (Signed)
PATIENT:  Jay Davidson  PRE-OPERATIVE DIAGNOSIS:  left Ureteral calculus  POST-OPERATIVE DIAGNOSIS: Same  PROCEDURE:  1. Cystoscopy with left retrograde pyelogram including interpretation 2. Left ureteroscopy with laser lithotripsy 3. Left ureteroscopic stone extraction 4. Left double-J stent placement  SURGEON: Garnett Farm, MD  INDICATION: Jay Davidson is a 58 year old male who is experiencing pelvic pain. A CT scan on 01/15/14 revealed a 6.3 mm stone in the distal left ureter with Hounsfield units of 1000. He was placed on medical expulsive therapy but her stone failed to progress. He has been having difficulty with intermittent pain and we therefore discussed ureteroscopic management of the stone. He presents today for treatment of that stone.  ANESTHESIA:  General  EBL:  Minimal  DRAINS: 4.8 French, 24 cm double-J stent in the left ureter (with string)  SPECIMEN:  Stone given to patient  DESCRIPTION OF PROCEDURE: The patient was taken to the major OR and placed on the table. General anesthesia was administered and then the patient was moved to the dorsal lithotomy position. The genitalia was sterilely prepped and draped. An official timeout was performed.  Initially the 22 French cystoscope with 12 lens was passed under direct vision. The bladder was then entered and fully inspected. It was noted be free of any tumors stones or inflammatory lesions. Ureteral orifices were of normal configuration and position. A 6 French open-ended ureteral catheter was then passed through the cystoscope into the ureteral orifice in order to perform a left retrograde pyelogram.  A retrograde pyelogram was performed by injecting full-strength contrast up the left ureter under direct fluoroscopic control. It revealed a filling defect in the ureter consistent with the stone seen on the preoperative imaging studies. The remainder of the ureter was noted to be normal as was the intrarenal collecting  system. I then passed a 0.038 inch floppy-tipped guidewire through the open ended catheter and into the area of the renal pelvis and this was left in place. The inner portion of a ureteral access sheath was then passed over the guidewire to gently dilate the intramural ureter. I then proceeded with ureteroscopy.  A 6 French rigid ureteroscope was then passed under direct into the bladder and into the left orifice and up the ureter. The stone was identified and I felt it was too large to extract and therefore elected to proceed with laser lithotripsy. The 200  holmium laser fiber was used to fragment the stone. I then used the nitinol basket to extract all of the stone fragments and reinspection of the ureter ureteroscopically revealed no further stone fragments and no injury to the ureter. I then backloaded the cystoscope over the guidewire and passed the stent over the guidewire into the area of the renal pelvis. As the guidewire was removed good curl was noted in the renal pelvis. The bladder was drained and the cystoscope was then removed. The patient tolerated the procedure well no intraoperative complications.  I instilled 2% lidocaine jelly in the urethra at the end of the procedure and applied a penile clamp. The patient received a B&O suppository and the string affixed to the distal aspect of the string was secured to the dorsum of the penis with tape.  PLAN OF CARE: Discharge to home after PACU  PATIENT DISPOSITION:  PACU - hemodynamically stable.

## 2014-02-02 NOTE — Anesthesia Postprocedure Evaluation (Signed)
  Anesthesia Post-op Note  Patient: Jay Davidson  Procedure(s) Performed: Procedure(s) (LRB): CYSTOSCOPY WITH LEFT RETRGRADE, URETEROSCOPY AND STENT PLACEMENT (Left) HOLMIUM LASER APPLICATION (Left)  Patient Location: PACU  Anesthesia Type: General  Level of Consciousness: awake and alert   Airway and Oxygen Therapy: Patient Spontanous Breathing  Post-op Pain: mild  Post-op Assessment: Post-op Vital signs reviewed, Patient's Cardiovascular Status Stable, Respiratory Function Stable, Patent Airway and No signs of Nausea or vomiting  Last Vitals:  Filed Vitals:   02/02/14 0817  BP: 133/55  Pulse: 77  Temp: 36.7 C  Resp: 14    Post-op Vital Signs: stable   Complications: No apparent anesthesia complications

## 2014-02-05 ENCOUNTER — Encounter (HOSPITAL_BASED_OUTPATIENT_CLINIC_OR_DEPARTMENT_OTHER): Payer: Self-pay | Admitting: Urology

## 2014-02-15 ENCOUNTER — Ambulatory Visit (INDEPENDENT_AMBULATORY_CARE_PROVIDER_SITE_OTHER): Payer: 59 | Admitting: Family Medicine

## 2014-02-15 VITALS — BP 147/67 | HR 75 | Temp 97.5°F | Resp 16 | Ht 69.0 in | Wt 173.8 lb

## 2014-02-15 DIAGNOSIS — G4700 Insomnia, unspecified: Secondary | ICD-10-CM

## 2014-02-15 DIAGNOSIS — G2581 Restless legs syndrome: Secondary | ICD-10-CM

## 2014-02-15 NOTE — Progress Notes (Signed)
   Subjective:    Patient ID: Jay Davidson, male    DOB: 1955-08-23, 58 y.o.   MRN: [id number]  HPI Patient has a history of right hip replacement x 2. Patient was taking tramadol 200 mg a day (4 doses a day). Patient was seen at orthopedics at [facility] and was told that he should dc tramadol. He had his last dose 6 days ago. Had sleeplessness nights and restless legs for 4 nights. Has some cold chills, these have since stopped. Last night was the worse since he was exhausted. Has tried nyquil and benadryl for sleep. He has done research and believes his symptoms are related to withdrawal. He mainly presents today to determine how long symptoms might last.   He has constant, low grade hip pain that is more irritating than anything.   The patient does not wish to go back on tramadol for taper. He is not interested in anything for sleep.    Review of Systems Subjective fevers, headache yesterday.    Objective:   Physical Exam  Vitals reviewed. Constitutional: He is oriented to person, place, and time. He appears well-developed and well-nourished.  HENT:  Head: Normocephalic and atraumatic.  Eyes: Conjunctivae are normal.  Neck: Normal range of motion. Neck supple.  Cardiovascular: Normal rate.   Pulmonary/Chest: Effort normal.  Musculoskeletal: Normal range of motion.  Neurological: He is alert and oriented to person, place, and time.  Skin: Skin is warm and dry.  Psychiatric: He has a normal mood and affect. His behavior is normal. Judgment and thought content normal.  Patient appears relaxed.       Assessment & Plan:  1. Insomnia - Discussed with Drs. Cleta Alberts and Neva Seat. Reassured patient that what he is going through, although uncomfortable, is not dangerous. Since he is opposed to going back on tramadol for taper, he will continue to monitor his symptoms, try to maintain good sleep hygiene, hydrate.   2. Restless legs -this has only occurred with stopping tramadol- expect these to  stop  3. Chronic right hip pain - patient wants to try NSAIDs for this. He reports having many different kinds at home -offered referral for pain clinic/PT, patient declined at this time   Emi Belfast, FNP-BC  [facility], [facility]  02/16/2014 5:44 PM

## 2014-02-27 ENCOUNTER — Telehealth: Payer: Self-pay | Admitting: Family Medicine

## 2014-02-27 ENCOUNTER — Encounter: Payer: Self-pay | Admitting: Family Medicine

## 2014-02-27 NOTE — Telephone Encounter (Signed)
Patient needs FMLA ppw completed for OV on 02/15/2014 which addressed his issues with insomnia and leg pain. Forms dropped off on 02/26/2014 and placed in Debbie Gessner's box on 02/27/2014. Please complete within 5-7 business and return to FMLA/disabilities tray at checkout when finished.

## 2014-03-02 NOTE — Telephone Encounter (Signed)
FMLA paperwork completed and placed in tray.

## 2014-03-05 ENCOUNTER — Encounter: Payer: Self-pay | Admitting: Family Medicine

## 2014-03-09 DIAGNOSIS — Z0271 Encounter for disability determination: Secondary | ICD-10-CM

## 2014-03-12 ENCOUNTER — Ambulatory Visit (INDEPENDENT_AMBULATORY_CARE_PROVIDER_SITE_OTHER): Payer: 59 | Admitting: Family Medicine

## 2014-03-12 VITALS — BP 120/80 | HR 70 | Temp 98.6°F | Resp 12 | Ht 68.25 in | Wt 176.4 lb

## 2014-03-12 DIAGNOSIS — Z9660 Presence of unspecified orthopedic joint implant: Secondary | ICD-10-CM

## 2014-03-12 DIAGNOSIS — Z96649 Presence of unspecified artificial hip joint: Secondary | ICD-10-CM

## 2014-03-12 DIAGNOSIS — G894 Chronic pain syndrome: Secondary | ICD-10-CM

## 2014-03-12 MED ORDER — TRAMADOL HCL 50 MG PO TABS: 50.0000 mg | ORAL_TABLET | Freq: Four times a day (QID) | ORAL | Status: DC | PRN

## 2014-03-12 NOTE — Progress Notes (Signed)
Subjective:  This chart was scribed for Jay SorensonEva Jiya Kissinger, MD, by Elon SpannerGarrett Cook, ED Scribe. This patient was seen in room Rm 3 and the patient's care was started at 8:36 PM.   Patient ID: Jay Davidson, male    DOB: [birth year], 58 y.o.   MRN: [id number] Chief Complaint  Patient presents with  . Hip Pain    Right-Had hip replacement x 2-Request Tramadol     HPI HPI Comments: Jay Davidson is a 58 y.o. male who has had two hip replacements and has been on tramadol 50 mg qid for over 8 years.  He was see in clinic one month prior after [facility] to [facility] the [facility]ad[facility].  He then began having withdrawal symptoms he attributed to insomnia, restless legs, and chills.  His hip pain was constant and worsening during this time.  Patient did not want to restart tramadol to taper off, so he was reassured that symptoms should abate gradually.  He was told to start anti-inflammatories.     [facility] of [facility] pain secondary to stopping his Tramadol.  Patient reports his hip pain has been constant and has prevented him from being able to sleep.   Patient denies having difficulty sleeping when previous taking the Tramadol. Patient wants to restart the Tramadol.   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    Current Outpatient Prescriptions on File Prior to Visit  Medication Sig Dispense Refill  . aspirin 81 MG chewable tablet Chew 81 mg by mouth daily.      . Multiple Vitamins-Minerals (MULTIVITAMIN WITH MINERALS) tablet Take 1 tablet by mouth daily.      . traMADol (ULTRAM) 50 MG tablet Take 50 mg by mouth every 6 (six) hours as needed.       No current facility-administered medications on file prior to visit.   No Known Allergies Past Medical History  Diagnosis Date  . Left ureteral calculus   . History of diverticulitis of colon   . Asymptomatic cholelithiasis     chronic  . OA (osteoarthritis)   . Incomplete RBBB   . At risk for sleep  apnea     STOP-BANG= 4      PT HAS NO PCP      Review of Systems  Psychiatric/Behavioral: Positive for sleep disturbance.       Objective:  BP 120/80  Pulse 70  Temp(Src) 98.6 F (37 C) (Oral)  Resp 12  Ht 5' 8.25" (1.734 m)  Wt 176 lb 6 oz (80.003 kg)  BMI 26.61 kg/m2  SpO2 98%  Physical Exam  Nursing note and vitals reviewed. Constitutional: He is oriented to person, place, and time. He appears well-developed and well-nourished. No distress.  HENT:  Head: Normocephalic and atraumatic.  No lymphadenopathy.   Eyes: Conjunctivae and EOM are normal.  Neck: Neck supple. No tracheal deviation present.  Cardiovascular: Normal rate, regular rhythm and normal heart sounds.   No murmur heard. Normal S1/S2  Pulmonary/Chest: Effort normal and breath sounds normal. No respiratory distress. He has no wheezes. He has no rales.  Musculoskeletal: Normal range of motion.  Neurological: He is alert and oriented to person, place, and time.  Skin: Skin is warm and dry.  Psychiatric: He has a normal mood and affect. His behavior is normal.          Assessment & Plan:  8:45 PMDiscussed plan to restart Tramadol with a  total of 200mg  qd.  Will follow-up in 3 months for refills - reviewed controlled substance policy Chronic pain syndrome  Status post hip replacement  Meds ordered this encounter  Medications  . traMADol (ULTRAM) 50 MG tablet    Sig: Take 1 tablet (50 mg total) by mouth every 6 (six) hours as needed for moderate pain.    Dispense:  120 tablet    Refill:  5    I personally performed the services described in this documentation, which was scribed in my presence. The recorded information has been reviewed and considered, and addended by me as needed.  Jay SorensonEva Mikaylah Libbey, MD MPH

## 2014-03-12 NOTE — Patient Instructions (Signed)
[facility] Policy for Prescribing Controlled Substances (Revised 03/2012) 1. Prescriptions for controlled substances will be filled by ONE provider at [facility] with whom you have established and developed a plan for your care, including follow-up. 2. You are encouraged to schedule an appointment with your prescriber at our appointment center for follow-up visits whenever possible. 3. If you request a prescription for the controlled substance while at [facility] for an acute problem (with someone other than your regular prescriber), you MAY be given a ONE-TIME prescription for a 30-day supply of the controlled substance, to allow time for you to return to see your regular prescriber for additional prescriptions. 

## 2014-06-06 ENCOUNTER — Ambulatory Visit (INDEPENDENT_AMBULATORY_CARE_PROVIDER_SITE_OTHER): Payer: 59 | Admitting: Family Medicine

## 2014-06-06 VITALS — BP 136/86 | HR 73 | Temp 98.3°F | Resp 16 | Ht 69.25 in | Wt 180.0 lb

## 2014-06-06 DIAGNOSIS — L739 Follicular disorder, unspecified: Secondary | ICD-10-CM

## 2014-06-06 MED ORDER — MUPIROCIN 2 % EX OINT: 1.0000 "application " | TOPICAL_OINTMENT | Freq: Three times a day (TID) | CUTANEOUS | Status: DC

## 2014-06-06 MED ORDER — DOXYCYCLINE HYCLATE 100 MG PO TABS: 100.0000 mg | ORAL_TABLET | Freq: Two times a day (BID) | ORAL | Status: DC

## 2014-06-06 NOTE — Patient Instructions (Signed)
Start both oral antibiotic and ointment as discussed, warm compresses, and Return to the clinic or go to the nearest emergency room if any of your symptoms worsen or new symptoms occur.  Folliculitis  Folliculitis is redness, soreness, and swelling (inflammation) of the hair follicles. This condition can occur anywhere on the body. People with weakened immune systems, diabetes, or obesity have a greater risk of getting folliculitis. CAUSES  Bacterial infection. This is the most common cause.  Fungal infection.  Viral infection.  Contact with certain chemicals, especially oils and tars. Long-term folliculitis can result from bacteria that live in the nostrils. The bacteria may trigger multiple outbreaks of folliculitis over time. SYMPTOMS Folliculitis most commonly occurs on the scalp, thighs, legs, back, buttocks, and areas where hair is shaved frequently. An early sign of folliculitis is a small, white or yellow, pus-filled, itchy lesion (pustule). These lesions appear on a red, inflamed follicle. They are usually less than 0.2 inches (5 mm) wide. When there is an infection of the follicle that goes deeper, it becomes a boil or furuncle. A group of closely packed boils creates a larger lesion (carbuncle). Carbuncles tend to occur in hairy, sweaty areas of the body. DIAGNOSIS  Your caregiver can usually tell what is wrong by doing a physical exam. A sample may be taken from one of the lesions and tested in a lab. This can help determine what is causing your folliculitis. TREATMENT  Treatment may include:  Applying warm compresses to the affected areas.  Taking antibiotic medicines orally or applying them to the skin.  Draining the lesions if they contain a large amount of pus or fluid.  Laser hair removal for cases of long-lasting folliculitis. This helps to prevent regrowth of the hair. HOME CARE INSTRUCTIONS  Apply warm compresses to the affected areas as directed by your  caregiver.  If antibiotics are prescribed, take them as directed. Finish them even if you start to feel better.  You may take over-the-counter medicines to relieve itching.  Do not shave irritated skin.  Follow up with your caregiver as directed. SEEK IMMEDIATE MEDICAL CARE IF:   You have increasing redness, swelling, or pain in the affected area.  You have a fever. MAKE SURE YOU:  Understand these instructions.  Will watch your condition.  Will get help right away if you are not doing well or get worse. Document Released: 07/20/2001 Document Revised: 11/10/2011 Document Reviewed: 08/11/2011 [facility] Patient Information 2015 [place]. This information is not intended to replace advice given to you by your health care provider. Make sure you discuss any questions you have with your health care provider.

## 2014-06-06 NOTE — Progress Notes (Signed)
Subjective:    Patient ID: Jay Davidson, male    DOB: 04-Apr-1956, 59 y.o.   MRN: [id number] This chart was scribed for [name], MD by Jolene Provost, Medical Scribe. This patient was seen in Room 11 and the patient's care was started a 4:24 PM.  Chief Complaint  Patient presents with  . Recurrent Skin Infections    back of neck x 2 weeks     HPI HPI Comments: Jay Davidson is a 59 y.o. male with a hx of RBBB who presents to [facility] complaining of recurrent skin infections on the back of his neck, that started two weeks ago. Pt states that the bumps have come and gone, but there has already been a tleast 1 bump for the last two weeks. Pt endorses associated pain and redness. Pt denies bug bites, fevers, chills, rashes, unexplained weight loss, night sweats, cough, sore throat or sick contacts. Pt denies new detergents, conditioners, shampoo or clothing. Pt denies being exposed to any person with mono.    Patient Active Problem List   Diagnosis Date Noted  . RIGHT BUNDLE BRANCH BLOCK 11/01/2009   Past Medical History  Diagnosis Date  . Left ureteral calculus   . History of diverticulitis of colon   . Asymptomatic cholelithiasis     chronic  . OA (osteoarthritis)   . Incomplete RBBB   . At risk for sleep apnea     STOP-BANG= 4      PT HAS NO PCP   Past Surgical History  Procedure Laterality Date  . 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 Left elbow reconstruction common extensor, lateral epicondyle  01-24-2002  . Elbow surgery Right 2001  . Knee arthroscopy Bilateral left 1984/   right 1996  . Transthoracic echocardiogram  11-26-2009    grade II diastolic dysfunction/  ef 55-60%/  mild AR & MR/  mild aortic sclerosis without stenosis  . Total hip revision Right 2012  . Cystoscopy with ureteroscopy and stent placement Left 02/02/2014    Procedure: CYSTOSCOPY WITH LEFT RETRGRADE, URETEROSCOPY AND STENT PLACEMENT;  Surgeon: Garnett Farm, MD;  Location: [facility];  Service: Urology;  Laterality: Left;  . Holmium laser application Left 02/02/2014    Procedure: HOLMIUM LASER APPLICATION;  Surgeon: Garnett Farm, MD;  Location: [facility];  Service: Urology;  Laterality: Left;   No Known Allergies Prior to Admission medications   Medication Sig Start Date End Date Taking? Authorizing Provider  aspirin 81 MG chewable tablet Chew 81 mg by mouth daily.   Yes Historical Provider, MD  Multiple Vitamins-Minerals (MULTIVITAMIN WITH MINERALS) tablet Take 1 tablet by mouth daily.   Yes Historical Provider, MD  traMADol (ULTRAM) 50 MG tablet Take 1 tablet (50 mg total) by mouth every 6 (six) hours as needed for moderate pain. 03/12/14  Yes Sherren Mocha, MD   History   Social History  . Marital Status: Married    Spouse Name: N/A    Number of Children: N/A  . Years of Education: N/A   Occupational History  . Not on file.   Social History Main Topics  . Smoking status: Former Smoker -- 1.00 packs/day for 20 years    Types: Cigarettes    Quit date: 01/31/1989  . Smokeless tobacco: Former Neurosurgeon    Types: Snuff, Chew    Quit date: 01/31/1989  . Alcohol Use: Yes  . Drug Use: No  . Sexual Activity: Not on file  Other Topics Concern  . Not on file   Social History Narrative     Review of Systems  Constitutional: Negative for fever and chills.  HENT: Positive for congestion. Negative for sore throat.   Respiratory: Negative for cough.   Skin: Positive for rash.  Psychiatric/Behavioral: Negative for sleep disturbance.       Objective:   Physical Exam  Constitutional: He is oriented to person, place, and time. He appears well-developed and well-nourished.  HENT:  Head: Normocephalic and atraumatic.  Right Ear: Tympanic membrane, external ear and ear canal normal.  Left Ear: Tympanic membrane, external ear and ear canal normal.  Nose: No rhinorrhea.  Mouth/Throat: Oropharynx is clear and moist and mucous membranes are  normal. No oropharyngeal exudate or posterior oropharyngeal erythema.  TMs normal.  Eyes: Conjunctivae are normal. Pupils are equal, round, and reactive to light.  Neck: Neck supple.  No lymphadenopathy.  Cardiovascular: Normal rate, regular rhythm, normal heart sounds and intact distal pulses.   No murmur heard. Pulmonary/Chest: Effort normal and breath sounds normal. No respiratory distress. He has no wheezes. He has no rhonchi. He has no rales.  Abdominal: Soft. There is no tenderness.  Lymphadenopathy:    He has no cervical adenopathy.  Neurological: He is alert and oriented to person, place, and time.  Skin: Skin is warm and dry. No rash noted.  Just below occipitis, four erythematous patches upper most areas with central pustules, measuring 1cm on upper right 8mm on upper left, two lower patches with erythematous patches. Right lateral neck with indurated area approximately 5mm without pustule. Lesions cross midline, no surrounding vesicals.  Psychiatric: He has a normal mood and affect. His behavior is normal.  Nursing note and vitals reviewed.   Filed Vitals:   06/06/14 1556  BP: 136/86  Pulse: 73  Temp: 98.3 F (36.8 C)  TempSrc: Oral  Resp: 16  Height: 5' 9.25" (1.759 m)  Weight: 180 lb (81.647 kg)  SpO2: 98%   Right upper neck pustule lifted with 18 gauge after alcohol swab. Small amount of white to yellow discharge obtained for culture.    Assessment & Plan:   [name] is a 59 y.o. male Acute folliculitis - Plan: Wound culture, doxycycline (VIBRA-TABS) 100 MG tablet, mupirocin ointment (BACTROBAN) 2 %  Unknown source. Wound cx pending. Start warm compresses, doxycyline, and bactroban top ointment. rtc precautions and SED of these meds.   Meds ordered this encounter  Medications  . doxycycline (VIBRA-TABS) 100 MG tablet    Sig: Take 1 tablet (100 mg total) by mouth 2 (two) times daily.    Dispense:  20 tablet    Refill:  0  . mupirocin ointment (BACTROBAN) 2  %    Sig: Apply 1 application topically 3 (three) times daily. For 1 week.    Dispense:  22 g    Refill:  0   Patient Instructions  Start both oral antibiotic and ointment as discussed, warm compresses, and Return to the clinic or go to the nearest emergency room if any of your symptoms worsen or new symptoms occur.  Folliculitis  Folliculitis is redness, soreness, and swelling (inflammation) of the hair follicles. This condition can occur anywhere on the body. People with weakened immune systems, diabetes, or obesity have a greater risk of getting folliculitis. CAUSES  Bacterial infection. This is the most common cause.  Fungal infection.  Viral infection.  Contact with certain chemicals, especially oils and tars. Long-term folliculitis can result from bacteria  that live in the nostrils. The bacteria may trigger multiple outbreaks of folliculitis over time. SYMPTOMS Folliculitis most commonly occurs on the scalp, thighs, legs, back, buttocks, and areas where hair is shaved frequently. An early sign of folliculitis is a small, white or yellow, pus-filled, itchy lesion (pustule). These lesions appear on a red, inflamed follicle. They are usually less than 0.2 inches (5 mm) wide. When there is an infection of the follicle that goes deeper, it becomes a boil or furuncle. A group of closely packed boils creates a larger lesion (carbuncle). Carbuncles tend to occur in hairy, sweaty areas of the body. DIAGNOSIS  Your caregiver can usually tell what is wrong by doing a physical exam. A sample may be taken from one of the lesions and tested in a lab. This can help determine what is causing your folliculitis. TREATMENT  Treatment may include:  Applying warm compresses to the affected areas.  Taking antibiotic medicines orally or applying them to the skin.  Draining the lesions if they contain a large amount of pus or fluid.  Laser hair removal for cases of long-lasting folliculitis. This  helps to prevent regrowth of the hair. HOME CARE INSTRUCTIONS  Apply warm compresses to the affected areas as directed by your caregiver.  If antibiotics are prescribed, take them as directed. Finish them even if you start to feel better.  You may take over-the-counter medicines to relieve itching.  Do not shave irritated skin.  Follow up with your caregiver as directed. SEEK IMMEDIATE MEDICAL CARE IF:   You have increasing redness, swelling, or pain in the affected area.  You have a fever. MAKE SURE YOU:  Understand these instructions.  Will watch your condition.  Will get help right away if you are not doing well or get worse. Document Released: 07/20/2001 Document Revised: 11/10/2011 Document Reviewed: 08/11/2011 [facility] Patient Information 2015 [place], [place]. This information is not intended to replace advice given to you by your health care provider. Make sure you discuss any questions you have with your health care provider.     I personally performed the services described in this documentation, which was scribed in my presence. The recorded information has been reviewed and considered, and addended by me as needed.

## 2014-06-09 LAB — WOUND CULTURE: Gram Stain: NONE SEEN

## 2014-09-07 ENCOUNTER — Ambulatory Visit (INDEPENDENT_AMBULATORY_CARE_PROVIDER_SITE_OTHER): Payer: 59 | Admitting: Family Medicine

## 2014-09-07 ENCOUNTER — Encounter: Payer: Self-pay | Admitting: Family Medicine

## 2014-09-07 VITALS — BP 141/72 | HR 65 | Temp 98.5°F | Resp 16 | Ht 69.0 in | Wt 180.8 lb

## 2014-09-07 DIAGNOSIS — G4700 Insomnia, unspecified: Secondary | ICD-10-CM

## 2014-09-07 DIAGNOSIS — R030 Elevated blood-pressure reading, without diagnosis of hypertension: Secondary | ICD-10-CM

## 2014-09-07 DIAGNOSIS — E7800 Pure hypercholesterolemia, unspecified: Secondary | ICD-10-CM

## 2014-09-07 DIAGNOSIS — IMO0001 Reserved for inherently not codable concepts without codable children: Secondary | ICD-10-CM

## 2014-09-07 DIAGNOSIS — M25559 Pain in unspecified hip: Secondary | ICD-10-CM

## 2014-09-07 DIAGNOSIS — G894 Chronic pain syndrome: Secondary | ICD-10-CM

## 2014-09-07 MED ORDER — TRAMADOL HCL 50 MG PO TABS: 50.0000 mg | ORAL_TABLET | Freq: Four times a day (QID) | ORAL | Status: DC | PRN

## 2014-09-07 MED ORDER — CYCLOBENZAPRINE HCL 10 MG PO TABS: 10.0000 mg | ORAL_TABLET | Freq: Two times a day (BID) | ORAL | Status: DC | PRN

## 2014-09-07 NOTE — Progress Notes (Signed)
Subjective:    Patient ID: Jay Davidson, male    DOB: Jan 07, 1956, 59 y.o.   MRN: [id number] This chart was scribed for Norberto Sorenson, MD by Littie Deeds, Medical Scribe. This patient was seen in Room 28 and the patient's care was started at 5:08 PM.   Chief Complaint  Patient presents with  . Medication Refill    needs refill on tramadol    HPI HPI Comments: Jay Davidson is a 59 y.o. male who presents to the [facility] for a medication refill.  Saw him 6 months ago. He had been on tramadol for over 8 years. He had 2 hip replacements at [facility]. Orthopedic surgeon took him off the tramadol, after which his pain worsened dramatically.  Hip Pain: Patient is still working. He does have some problems sleeping at night due to the hip pain. He has tried Aleve and other OTC medications. He has not tried muscle relaxer in the past. Patient has been taking 2 tramadol in the morning and 2 in the evening.   Health Maintenance: He does check his blood pressure at home; he rarely gets blood pressure readings in the 140s. He has not had a complete physical in about 2 years but plans to schedule one.    Past Medical History  Diagnosis Date  . Left ureteral calculus   . History of diverticulitis of colon   . Asymptomatic cholelithiasis     chronic  . OA (osteoarthritis)   . Incomplete RBBB   . At risk for sleep apnea     STOP-BANG= 4      PT HAS NO PCP   Current Outpatient Prescriptions on File Prior to Visit  Medication Sig Dispense Refill  . aspirin 81 MG chewable tablet Chew 81 mg by mouth daily.    . Multiple Vitamins-Minerals (MULTIVITAMIN WITH MINERALS) tablet Take 1 tablet by mouth daily.    . mupirocin ointment (BACTROBAN) 2 % Apply 1 application topically 3 (three) times daily. For 1 week. (Patient not taking: Reported on 09/07/2014) 22 g 0   No current facility-administered medications on file prior to visit.   No Known Allergies   Review of Systems    Constitutional: Negative for fever, chills, diaphoresis, activity change and appetite change.  Musculoskeletal: Positive for back pain, joint swelling and arthralgias. Negative for myalgias and gait problem.  Skin: Negative for color change, rash and wound.  Neurological: Negative for weakness and numbness.  Hematological: Negative for adenopathy. Does not bruise/bleed easily.  Psychiatric/Behavioral: Positive for sleep disturbance. Negative for dysphoric mood. The patient is not nervous/anxious.        Objective:  BP 141/72 mmHg  Pulse 65  Temp(Src) 98.5 F (36.9 C) (Oral)  Resp 16  Ht  (1.753 m)  Wt 180 lb 12.8 oz (82.01 kg)  BMI 26.69 kg/m2  SpO2 97%  Physical Exam  Constitutional: He is oriented to person, place, and time. He appears well-developed and well-nourished. No distress.  HENT:  Head: Normocephalic and atraumatic.  Mouth/Throat: Oropharynx is clear and moist. No oropharyngeal exudate.  Eyes: Pupils are equal, round, and reactive to light.  Neck: Neck supple. No thyromegaly present.  Normal thyroid.  Cardiovascular: Normal rate, regular rhythm, S1 normal, S2 normal and normal heart sounds.   No murmur heard. Pulmonary/Chest: Effort normal and breath sounds normal. No respiratory distress. He has no wheezes. He has no rales.  CTAB. Excellent air movement.  Musculoskeletal: He exhibits no edema.  Lymphadenopathy:    He has no cervical adenopathy.  Neurological: He is alert and oriented to person, place, and time. No cranial nerve deficit.  Skin: Skin is warm and dry. No rash noted.  Psychiatric: He has a normal mood and affect. His behavior is normal.  Vitals reviewed.     Assessment & Plan:   1. Hip pain, unspecified laterality - try qhs flexeril to help w/ stiffness and insomnia due to pain  2. Insomnia   3. Elevated cholesterol   4. Chronic pain syndrome - pt on pain contract w/ me - needs OV for all tramadol refills. 5. Elevated BP - borderline HTN  - try dash diet - recheck at f/u.  Rec sched CPE at next OV in 6 mos.   Meds ordered this encounter  Medications  . traMADol (ULTRAM) 50 MG tablet    Sig: Take 1-2 tablets (50-100 mg total) by mouth every 6 (six) hours as needed for moderate pain.    Dispense:  120 tablet    Refill:  5  . cyclobenzaprine (FLEXERIL) 10 MG tablet    Sig: Take 1 tablet (10 mg total) by mouth 3 times/day as needed-between meals & bedtime for muscle spasms.    Dispense:  30 tablet    Refill:  5    I personally performed the services described in this documentation, which was scribed in my presence. The recorded information has been reviewed and considered, and addended by me as needed.  Norberto SorensonEva Chrystie Hagwood, MD MPH

## 2014-09-11 DIAGNOSIS — R030 Elevated blood-pressure reading, without diagnosis of hypertension: Secondary | ICD-10-CM | POA: Insufficient documentation

## 2014-09-11 DIAGNOSIS — M25559 Pain in unspecified hip: Secondary | ICD-10-CM | POA: Insufficient documentation

## 2014-09-11 DIAGNOSIS — G4700 Insomnia, unspecified: Secondary | ICD-10-CM | POA: Insufficient documentation

## 2014-09-11 DIAGNOSIS — G894 Chronic pain syndrome: Secondary | ICD-10-CM | POA: Insufficient documentation

## 2014-09-11 DIAGNOSIS — E7800 Pure hypercholesterolemia, unspecified: Secondary | ICD-10-CM | POA: Insufficient documentation

## 2014-10-02 ENCOUNTER — Ambulatory Visit (INDEPENDENT_AMBULATORY_CARE_PROVIDER_SITE_OTHER): Payer: 59

## 2014-10-02 ENCOUNTER — Ambulatory Visit (INDEPENDENT_AMBULATORY_CARE_PROVIDER_SITE_OTHER): Payer: 59 | Admitting: Family Medicine

## 2014-10-02 VITALS — BP 118/60 | HR 72 | Temp 97.9°F | Resp 15 | Ht 69.5 in | Wt 180.6 lb

## 2014-10-02 DIAGNOSIS — M7741 Metatarsalgia, right foot: Secondary | ICD-10-CM | POA: Diagnosis not present

## 2014-10-02 DIAGNOSIS — M79671 Pain in right foot: Secondary | ICD-10-CM

## 2014-10-02 LAB — COMPREHENSIVE METABOLIC PANEL
ALBUMIN: 4.4 g/dL (ref 3.5–5.2)
ALK PHOS: 62 U/L (ref 39–117)
ALT: 18 U/L (ref 0–53)
AST: 23 U/L (ref 0–37)
BUN: 15 mg/dL (ref 6–23)
CALCIUM: 9.8 mg/dL (ref 8.4–10.5)
CHLORIDE: 104 meq/L (ref 96–112)
CO2: 29 mEq/L (ref 19–32)
Creat: 1.01 mg/dL (ref 0.50–1.35)
GLUCOSE: 105 mg/dL — AB (ref 70–99)
Potassium: 4.6 mEq/L (ref 3.5–5.3)
Sodium: 140 mEq/L (ref 135–145)
Total Bilirubin: 0.9 mg/dL (ref 0.2–1.2)
Total Protein: 6.9 g/dL (ref 6.0–8.3)

## 2014-10-02 MED ORDER — MELOXICAM 15 MG PO TABS: 15.0000 mg | ORAL_TABLET | Freq: Every day | ORAL | Status: DC

## 2014-10-02 MED ORDER — HYDROCODONE-ACETAMINOPHEN 5-325 MG PO TABS: 1.0000 | ORAL_TABLET | Freq: Four times a day (QID) | ORAL | Status: DC | PRN

## 2014-10-02 NOTE — Progress Notes (Signed)
Patient ID: Jay LeatherwoodJon H Florek, male   DOB: August 05, 1955, 59 y.o.   MRN: [id number] [facility]  Right foot pain:  4 weeks ago he was at the beach and completed 4-5 miles on the treadmill.  He stated about 8 hours after completing his workout felt a sharp pain in his right foot on the  top of 2-4 metatarsal. 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 The pain has become difficult to manage last 2 weeks and hard to walk on the last 2 days. He states it hurts the most when he is plantar flexed during a step with the most pressure applied. He currently takes  tramadol for hip replacement and he has tried aleve as well for his foot without great benefit. He thinks maybe he has a  stress fracture. He denies swelling, bruising or redness. No prior trauma to the area. He does have plantars fascitis bilaterally and has had recent shoe inserts made for him.  Former Smoker Past Medical History  Diagnosis Date  . Left ureteral calculus   . History of diverticulitis of colon   . Asymptomatic cholelithiasis     chronic  . OA (osteoarthritis)   . Incomplete RBBB   . At risk for sleep apnea     STOP-BANG= 4      PT HAS NO PCP   No Known Allergies Past Surgical History  Procedure Laterality Date  . 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 Left elbow reconstruction common extensor, lateral epicondyle  01-24-2002  . Elbow surgery Right 2001  . Knee arthroscopy Bilateral left 1984/   right 1996  . Transthoracic echocardiogram  11-26-2009    grade II diastolic dysfunction/  ef 55-60%/  mild AR & MR/  mild aortic sclerosis without stenosis  . Total hip revision Right 2012  . Cystoscopy with ureteroscopy and stent placement Left 02/02/2014    Procedure: CYSTOSCOPY WITH LEFT RETRGRADE, URETEROSCOPY AND STENT PLACEMENT;  Surgeon: Garnett FarmMark C Ottelin, MD;  [facility] [place];  Service: Urology;  Laterality: Left;  . Holmium laser  application Left 02/02/2014    Procedure: HOLMIUM LASER APPLICATION;  Surgeon: Garnett FarmMark C Ottelin, MD;  [facility] [place];  Service: Urology;  Laterality: Left;   Objective: BP 118/60 mmHg  Pulse 72  Temp(Src) 97.9 F (36.6 C) (Oral)  Resp 15  Ht 5' 9.5" (1.765 m)  Wt 180 lb 9.6 oz (81.92 kg)  BMI 26.30 kg/m2  SpO2 97% Gen: NAD. Nontoxic in appearance. Physically fit caucasian male.  Right lower WUJ:[id number]:Limping.  No erythema. No edema. No swelling. TTP 2-3 rd metatarsal heads and shaft only.  Full ROM. Pain with plantar flexion with pressure only (walking). Negative squeeze test. Neurovascularly intact distally.   Right foot DG Xray: No acute fx.    A/P: Jay Davidson is a 59 y.o. with complaints of right foot pain for 4 weeks after working out on a treadmill. Xray is without acute process or fracture.  Pain likely due to metatarsalgia, possibly morton's neuroma formation.  - Provided with Mobic daily for 30 days; BMP collected. Last Creatine mildly above baseline secondary to kidney stone at that time. Prior renal fx normal. Discussed avoidance of other NSAIDS while taking Mobic. Take Mobic with meal.  - Increased pain medication temporarily to Hydrocodone (from tramadol prescribed for chronic hip pain). - encouraged him to follow up with Podiatrist for metatarsal head foot pads -  Discussed the possibility of light duty only if needed after start of medications and foot padding. Patient was agreeable to plan.  - F/U as needed, or sooner if pain worsens or does not improve with plan.  Felix PaciniKuneff, Jyla Hopf DO PGY3 [facility]

## 2014-10-02 NOTE — Patient Instructions (Signed)
Morton's Neuroma Neuralgia (nerve pain) or neuroma (benign [non-cancerous] nerve tumor) may develop on any interdigital nerve. The interdigital nerves (nerves between digits) of the foot travel beneath and between the metatarsals (long bones of the fore foot) and pass the nerve endings to the toes. The third interdigital is a common place for a small neuroma to form called Morton's neuroma. Another nerve to be affected commonly is the fourth interdigital nerve. This would be in approximately in the area of the base or ball under the bottom of your fourth toe. This condition occurs more commonly in women and is usually on one side. It is usually first noticed by pain radiating (spreading) to the ball of the foot or to the toes. CAUSES The cause of interdigital neuralgia may be from low grade repetitive trauma (damage caused by an accident) as in activities causing a repeated pounding of the foot (running, jumping etc.). It is also caused by improper footwear or recent loss of the fatty padding on the bottom of the foot. TREATMENT  The condition often resolves (goes away) simply with decreasing activity if that is thought to be the cause. Proper shoes are beneficial. Orthotics (special foot support aids) such as a metatarsal bar are often beneficial. This condition usually responds to conservative therapy, however if surgery is necessary it usually brings complete relief. HOME CARE INSTRUCTIONS   Apply ice to the area of soreness for 15-20 minutes, 03-04 times per day, while awake for the first 2 days. Put ice in a plastic bag and place a towel between the bag of ice and your skin.  Only take over-the-counter or prescription medicines for pain, discomfort, or fever as directed by your caregiver. MAKE SURE YOU:   Understand these instructions.  Will watch your condition.  Will get help right away if you are not doing well or get worse. Document Released: 08/17/2000 Document Revised: 08/03/2011  Document Reviewed: 07/12/2013 [facility] Patient Information 2015 [place], [place]. This information is not intended to replace advice given to you by your health care provider. Make sure you discuss any questions you have with your health care provider.  You are having Metatarsalgia. We have prescribed an anti-inflammatory called Mobic for you to take daily for a month. If pain continues after that or worsens then please return sooner or see your podiatrist. You should see your podiatrist as well, as soon as possible, as they can add a pad to the bottom of your foot that will help relieve the pressure and encourage healing.

## 2014-10-02 NOTE — Progress Notes (Signed)
[facility] reading (PRIMARY) by  Dr. Clelia CroftShaw. Rt foot xray:  No acute abnormality.   Reviewed documentation and xray, pt independently seen and evaluated by myself and agree w/ excellent assessment and plan from Dr. Claiborne BillingsKuneff PGY3.  Norberto SorensonEva Batul Diego, MD MPH

## 2014-10-03 ENCOUNTER — Encounter: Payer: Self-pay | Admitting: Family Medicine

## 2015-01-11 ENCOUNTER — Ambulatory Visit (INDEPENDENT_AMBULATORY_CARE_PROVIDER_SITE_OTHER): Payer: 59

## 2015-01-11 ENCOUNTER — Ambulatory Visit (INDEPENDENT_AMBULATORY_CARE_PROVIDER_SITE_OTHER): Payer: 59 | Admitting: Family Medicine

## 2015-01-11 VITALS — BP 130/82 | HR 74 | Temp 98.4°F | Resp 18 | Ht 69.0 in | Wt 175.6 lb

## 2015-01-11 DIAGNOSIS — S76111A Strain of right quadriceps muscle, fascia and tendon, initial encounter: Secondary | ICD-10-CM | POA: Diagnosis not present

## 2015-01-11 DIAGNOSIS — M79651 Pain in right thigh: Secondary | ICD-10-CM | POA: Diagnosis not present

## 2015-01-11 NOTE — Patient Instructions (Signed)
Apply ice for 15-20 minutes for 5 times daily for several days  Keep moving  Stay off work through next week  Take your baby aspirin daily  Take naproxen 2 times daily  If problems persist may need to get physical therapy  Return if needed

## 2015-01-11 NOTE — Progress Notes (Signed)
  Subjective:  Patient ID: Jay Davidson, male    DOB: Aug 17, 1955  Age: 59 y.o. MRN: [id number]  59 year old man who was playing tennis yesterday. He ran for a lot but the net and felt a pop in his right thigh with sudden acute pain. He has a hip prosthesis on the right tennis had it replaced once. He was concerned about it. He continues to hurt today.    Objective:   Very minimal ecchymosis visible in the right anterior thigh. I think I can feel a little dimpling EN on palpation. Is very tender in the mid upper anterior thigh. He walks with a limp. 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 Acute pain injury. History of hip replacement  Plan:  X-ray right hip  [facility] reading (PRIMARY) by  Dr. Alwyn Ren Normal femur with hip prosthesis in place.    Patient Instructions  Apply ice for 15-20 minutes for 5 times daily for several days  Keep moving  Stay off work through next week  Take your baby aspirin daily  Take naproxen 2 times daily  If problems persist may need to get physical therapy  Return if needed     Jay Shambaugh, MD 01/11/2015

## 2015-01-24 ENCOUNTER — Telehealth: Payer: Self-pay | Admitting: Family Medicine

## 2015-01-24 NOTE — Telephone Encounter (Signed)
Patient dropped off FMLA forms on 01/21/2015. They have been completed and need to be signed by you. They have been placed in your box on 01/24/2015. Return to [facility] tray at checkout upon completion.   Thanks, [employer]

## 2015-01-26 NOTE — Telephone Encounter (Signed)
Forms done. Return if further problems.

## 2015-01-27 NOTE — Telephone Encounter (Signed)
Forms faxed to [phone] per patients request

## 2015-01-29 ENCOUNTER — Telehealth: Payer: Self-pay | Admitting: Family Medicine

## 2015-01-29 NOTE — Telephone Encounter (Signed)
Was a copy of patient's completed FMLA forms made? The previous phone message documented stated to return the forms to the FMLA tray at checkout so that we can make a copy of them and scan them to patient's chart.

## 2015-01-31 NOTE — Telephone Encounter (Signed)
I do not know what became of them.  I signed and put a note to copy and give to patient, and put it at TL desk if I recall.  Janace Hoard MD

## 2015-01-31 NOTE — Telephone Encounter (Signed)
Pt stopped in and brought forms from employer that were not completed. I am placing them in Dr [employer] box for him to fill out. The pt would like [place] to mail them back in the envelope provided. Thank you

## 2015-02-08 NOTE — Telephone Encounter (Signed)
Forms received back from Dr. Alwyn Ren and they were faxed on 02/08/2015 at 11:47am

## 2015-02-09 NOTE — Telephone Encounter (Signed)
noted 

## 2015-02-15 DIAGNOSIS — Z0271 Encounter for disability determination: Secondary | ICD-10-CM

## 2015-02-16 NOTE — Telephone Encounter (Signed)
Patient has brought incomplete FMLA forms back again for a third time. This has been ongoing since late August. I have completed the forms yet again based on office notes from Dr. Alwyn Ren. I put these forms in your box on 02/16/2015. Please review and sign. PLEASE RETURN FORMS TO THE FMLA TRAY AT CHECKOUT. I WILL FAX THESE FORMS FORM THE PATIENT. PLEASE PLACE THESE FORMS IN THE FMLA TRAY AT CHECKOUT UPON REVIEW AND SIGNATURE.

## 2015-02-25 NOTE — Telephone Encounter (Signed)
Forms received back from Dr. Alwyn Ren. They were faxed on 02/25/2015. They will be scanned on 02/26/2015

## 2015-03-14 ENCOUNTER — Encounter: Payer: Self-pay | Admitting: Family Medicine

## 2015-03-14 ENCOUNTER — Ambulatory Visit (INDEPENDENT_AMBULATORY_CARE_PROVIDER_SITE_OTHER): Payer: 59 | Admitting: Family Medicine

## 2015-03-14 VITALS — BP 128/70 | HR 68 | Temp 98.6°F | Resp 16 | Ht 68.5 in | Wt 180.2 lb

## 2015-03-14 DIAGNOSIS — I4510 Unspecified right bundle-branch block: Secondary | ICD-10-CM

## 2015-03-14 DIAGNOSIS — G8929 Other chronic pain: Secondary | ICD-10-CM | POA: Diagnosis not present

## 2015-03-14 DIAGNOSIS — Z125 Encounter for screening for malignant neoplasm of prostate: Secondary | ICD-10-CM | POA: Diagnosis not present

## 2015-03-14 DIAGNOSIS — Z1389 Encounter for screening for other disorder: Secondary | ICD-10-CM

## 2015-03-14 DIAGNOSIS — Z1383 Encounter for screening for respiratory disorder NEC: Secondary | ICD-10-CM

## 2015-03-14 DIAGNOSIS — Z0000 Encounter for general adult medical examination without abnormal findings: Secondary | ICD-10-CM | POA: Diagnosis not present

## 2015-03-14 DIAGNOSIS — Z130 Encounter for screening for diseases of the blood and blood-forming organs and certain disorders involving the immune mechanism: Secondary | ICD-10-CM | POA: Diagnosis not present

## 2015-03-14 DIAGNOSIS — M25552 Pain in left hip: Secondary | ICD-10-CM | POA: Diagnosis not present

## 2015-03-14 DIAGNOSIS — Z1329 Encounter for screening for other suspected endocrine disorder: Secondary | ICD-10-CM

## 2015-03-14 DIAGNOSIS — Z23 Encounter for immunization: Secondary | ICD-10-CM

## 2015-03-14 DIAGNOSIS — Z113 Encounter for screening for infections with a predominantly sexual mode of transmission: Secondary | ICD-10-CM | POA: Diagnosis not present

## 2015-03-14 DIAGNOSIS — Z136 Encounter for screening for cardiovascular disorders: Secondary | ICD-10-CM | POA: Diagnosis not present

## 2015-03-14 LAB — POCT URINALYSIS DIP (MANUAL ENTRY)
BILIRUBIN UA: NEGATIVE
GLUCOSE UA: NEGATIVE
Leukocytes, UA: NEGATIVE
Nitrite, UA: NEGATIVE
Protein Ur, POC: NEGATIVE
Spec Grav, UA: >=1.03
UROBILINOGEN UA: 0.2
pH, UA: 5

## 2015-03-14 LAB — CBC
HCT: 41.7 % (ref 39.0–52.0)
Hemoglobin: 14.5 g/dL (ref 13.0–17.0)
MCH: 31.5 pg (ref 26.0–34.0)
MCHC: 34.8 g/dL (ref 30.0–36.0)
MCV: 90.5 fL (ref 78.0–100.0)
MPV: 10.1 fL (ref 8.6–12.4)
PLATELETS: 273 10*3/uL (ref 150–400)
RBC: 4.61 MIL/uL (ref 4.22–5.81)
RDW: 13.2 % (ref 11.5–15.5)
WBC: 5.5 10*3/uL (ref 4.0–10.5)

## 2015-03-14 LAB — COMPREHENSIVE METABOLIC PANEL
ALT: 18 U/L (ref 9–46)
AST: 26 U/L (ref 10–35)
Albumin: 4.4 g/dL (ref 3.6–5.1)
Alkaline Phosphatase: 53 U/L (ref 40–115)
BILIRUBIN TOTAL: 1.4 mg/dL — AB (ref 0.2–1.2)
BUN: 17 mg/dL (ref 7–25)
CO2: 26 mmol/L (ref 20–31)
CREATININE: 0.83 mg/dL (ref 0.70–1.33)
Calcium: 9.5 mg/dL (ref 8.6–10.3)
Chloride: 104 mmol/L (ref 98–110)
GLUCOSE: 80 mg/dL (ref 65–99)
Potassium: 4.2 mmol/L (ref 3.5–5.3)
SODIUM: 142 mmol/L (ref 135–146)
Total Protein: 7.3 g/dL (ref 6.1–8.1)

## 2015-03-14 LAB — LIPID PANEL
Cholesterol: 246 mg/dL — ABNORMAL HIGH (ref 125–200)
HDL: 68 mg/dL (ref >=40)
LDL Cholesterol: 165 mg/dL — ABNORMAL HIGH (ref <130)
TRIGLYCERIDES: 63 mg/dL (ref <150)
Total CHOL/HDL Ratio: 3.6 Ratio (ref <=5.0)
VLDL: 13 mg/dL (ref <30)

## 2015-03-14 MED ORDER — TRAMADOL HCL 50 MG PO TABS: 50.0000 mg | ORAL_TABLET | Freq: Four times a day (QID) | ORAL | Status: DC | PRN

## 2015-03-14 MED ORDER — CYCLOBENZAPRINE HCL 10 MG PO TABS: 10.0000 mg | ORAL_TABLET | Freq: Two times a day (BID) | ORAL | Status: DC | PRN

## 2015-03-14 NOTE — Patient Instructions (Signed)

## 2015-03-14 NOTE — Progress Notes (Signed)
Subjective:    Patient ID: Jay Davidson, male    DOB: [birth year], 59 y.o.   MRN: [id number] Chief Complaint  Patient presents with  . Annual Exam  . Medication Refill    HPI  Borderline BP - following outside office  Urology - seen Dr. Vernie Ammonsttelin in the past for a kidney stone.  colnoscopy done 2014  Past Medical History  Diagnosis Date  . Left ureteral calculus   . History of diverticulitis of colon   . Asymptomatic cholelithiasis     chronic  . OA (osteoarthritis)   . Incomplete RBBB   . At risk for sleep apnea     STOP-BANG= 4      PT HAS NO PCP   Past Surgical History  Procedure Laterality Date  . 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 Left elbow reconstruction common extensor, lateral epicondyle  01-24-2002  . Elbow surgery Right 2001  . Knee arthroscopy Bilateral left 1984/   right 1996  . Transthoracic echocardiogram  11-26-2009    grade II diastolic dysfunction/  ef 55-60%/  mild AR & MR/  mild aortic sclerosis without stenosis  . Total hip revision Right 2012  . Cystoscopy with ureteroscopy and stent placement Left 02/02/2014    Procedure: CYSTOSCOPY WITH LEFT RETRGRADE, URETEROSCOPY AND STENT PLACEMENT;  Surgeon: Garnett FarmMark C Ottelin, MD;  [facility];  Service: Urology;  Laterality: Left;  . Holmium laser application Left 02/02/2014    Procedure: HOLMIUM LASER APPLICATION;  Surgeon: Garnett FarmMark C Ottelin, MD;  [facility] [place];  Service: Urology;  Laterality: Left;   Current Outpatient Prescriptions on File Prior to Visit  Medication Sig Dispense Refill  . aspirin 81 MG chewable tablet Chew 81 mg by mouth daily.    . Multiple Vitamins-Minerals (MULTIVITAMIN WITH MINERALS) tablet Take 1 tablet by mouth daily.     No current facility-administered medications on file prior to visit.   No Known Allergies Family History  Problem Relation Age of Onset  . Hypertension Mother   . Mental illness Father    Social History    Social History  . Marital Status: Married    Spouse Name: N/A  . Number of Children: N/A  . Years of Education: N/A   Occupational History  . 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 Smoking status: Former Smoker -- 1.00 packs/day for 20 years    Types: Cigarettes    Quit date: 01/31/1989  . Smokeless tobacco: Former NeurosurgeonUser    Types: Snuff, Chew    Quit date: 01/31/1989  . Alcohol Use: 7.2 oz/week    12 Standard drinks or equivalent per week  . Drug Use: No  . Sexual Activity: Yes   Other Topics Concern  . None   Social History Narrative   Divorced.    Review of Systems  All other systems reviewed and are negative.      Objective:  BP 128/70 mmHg  Pulse 68  Temp(Src) 98.6 F (37 C) (Oral)  Resp 16  Ht 5' 8.5" (1.74 m)  Wt 180 lb 3.2 oz (81.738 kg)  BMI 27.00 kg/m2  SpO2 96%  Physical Exam  Constitutional: He is oriented to person, place, and time. He appears well-developed and well-nourished. No distress.  HENT:  Head: Normocephalic and atraumatic.  Right Ear: Tympanic membrane, external ear and ear canal normal.  Left Ear: Tympanic membrane, external ear and ear canal normal.  Nose: Nose normal.  Mouth/Throat: Uvula is midline, oropharynx is clear and moist and mucous membranes are normal. No oropharyngeal exudate.  Eyes: Conjunctivae are normal. Right eye exhibits no discharge. Left eye exhibits no discharge. No scleral icterus.  Neck: Normal range of motion. Neck supple. No thyromegaly present.  Cardiovascular: Normal rate, regular rhythm, normal heart sounds and intact distal pulses.   Pulmonary/Chest: Effort normal and breath sounds normal. No respiratory distress.  Abdominal: Soft. Bowel sounds are normal. He exhibits no distension and no mass. There is no tenderness. There is no rebound and no guarding.  Genitourinary: Rectum normal and prostate normal. Rectal exam shows no mass and anal tone normal. Prostate is not enlarged and not tender.   Musculoskeletal: He exhibits no edema.  Lymphadenopathy:    He has no cervical adenopathy.  Neurological: He is alert and oriented to person, place, and time. He has normal reflexes. No cranial nerve deficit. He exhibits normal muscle tone.  Skin: Skin is warm and dry. No rash noted. He is not diaphoretic. No erythema.  Psychiatric: He has a normal mood and affect. His behavior is normal.         [facility] reading (PRIMARY) by  Dr. Clelia Croft. EKG: NSR, PR interval appears short to me (80-100, machine read at 120) Incomplete RBBB which is unchanged from prior Assessment & Plan:  Hiv, hep c, tetanus, flu,  Lipid, psa, ua, cbc, tsh, cmp ASCVD risk cal with todays values give pt a 62yr risk of 6.5% so work on tlc/diet changes to lower risk to ideal of 4.3%  1. Flu vaccine need   2. Annual physical exam   3. Screening for cardiovascular, respiratory, and genitourinary diseases   4. Screening for deficiency anemia   5. Screening for prostate cancer   6. Screening for thyroid disorder   7. Chronic hip pain, left   8. Right bundle branch block   9. Routine screening for STI (sexually transmitted infection)     Orders Placed This Encounter  Procedures  . Flu Vaccine QUAD 36+ mos IM  . CBC  . Comprehensive metabolic panel    Order Specific Question:  Has the patient fasted?    Answer:  Yes  . Lipid panel    Order Specific Question:  Has the patient fasted?    Answer:  Yes  . PSA  . TSH  . HIV antibody  . Hepatitis C Ab Reflex HCV RNA, QUANT  . Hepatitis C antibody  . POCT urinalysis dipstick  . EKG 12-Lead    Meds ordered this encounter  Medications  . traMADol (ULTRAM) 50 MG tablet    Sig: Take 1-2 tablets (50-100 mg total) by mouth every 6 (six) hours as needed for moderate pain.    Dispense:  120 tablet    Refill:  5  . cyclobenzaprine (FLEXERIL) 10 MG tablet    Sig: Take 1 tablet (10 mg total) by mouth 3 times/day as needed-between meals & bedtime for muscle spasms.     Dispense:  60 tablet    Refill:  5    Norberto Sorenson, MD MPH

## 2015-03-15 ENCOUNTER — Encounter: Payer: 59 | Admitting: Family Medicine

## 2015-03-15 LAB — HIV ANTIBODY (ROUTINE TESTING W REFLEX): HIV: NONREACTIVE

## 2015-03-15 LAB — TSH: TSH: 0.847 u[IU]/mL (ref 0.350–4.500)

## 2015-03-15 LAB — PSA: PSA: 1.04 ng/mL (ref <=4.00)

## 2015-03-15 LAB — HEPATITIS C ANTIBODY: HCV Ab: NEGATIVE

## 2015-03-16 ENCOUNTER — Encounter: Payer: Self-pay | Admitting: Family Medicine

## 2015-09-11 NOTE — Progress Notes (Signed)
Subjective:    Patient ID: Jay Davidson, male    DOB: 1955/09/24, 60 y.o.   MRN: [id number] Chief Complaint  Patient presents with  . Medication Refill    FLEXERIL and tramadol    HPIMr. Davidson is a delightful 60 yo male here today for a routine 6 mo follow-up on his chronic medication problems and med refills.  Elevated BP: checking outside office and is 110-120. HPL: prior ASCVD risk 6.5% so work on tlc.  Has not been eating much meat.  He does have congentially high chol and he had a detailed lipoprofile break down which was reassuring.  He is fasting this morning. Is not a breakfast person.  Does not need the flexeril daily - mainly hat night if not sleeping  Past Medical History  Diagnosis Date  . Left ureteral calculus   . History of diverticulitis of colon   . Asymptomatic cholelithiasis     chronic  . OA (osteoarthritis)   . Incomplete RBBB   . At risk for sleep apnea     STOP-BANG= 4      PT HAS NO PCP   Past Surgical History  Procedure Laterality Date  . 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 Left elbow reconstruction common extensor, lateral epicondyle  01-24-2002  . Elbow surgery Right 2001  . Knee arthroscopy Bilateral left 1984/   right 1996  . Transthoracic echocardiogram  11-26-2009    grade II diastolic dysfunction/  ef 55-60%/  mild AR & MR/  mild aortic sclerosis without stenosis  . Total hip revision Right 2012  . Cystoscopy with ureteroscopy and stent placement Left 02/02/2014    Procedure: CYSTOSCOPY WITH LEFT RETRGRADE, URETEROSCOPY AND STENT PLACEMENT;  Surgeon: Garnett Farm, MD;  Location: [facility];  Service: Urology;  Laterality: Left;  . Holmium laser application Left 02/02/2014    Procedure: HOLMIUM LASER APPLICATION;  Surgeon: Garnett Farm, MD;  Location: [facility];  Service: Urology;  Laterality: Left;   Current Outpatient Prescriptions on File Prior to Visit  Medication Sig Dispense Refill  . aspirin  81 MG chewable tablet Chew 81 mg by mouth daily.    . Multiple Vitamins-Minerals (MULTIVITAMIN WITH MINERALS) tablet Take 1 tablet by mouth daily.     No current facility-administered medications on file prior to visit.   No Known Allergies Family History  Problem Relation Age of Onset  . Hypertension Mother   . Mental illness Father    Social History   Social History  . Marital Status: Married    Spouse Name: N/A  . Number of Children: N/A  . Years of Education: N/A   Occupational History  . 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 Smoking status: Former Smoker -- 1.00 packs/day for 20 years    Types: Cigarettes    Quit date: 01/31/1989  . Smokeless tobacco: Former Neurosurgeon    Types: Snuff, Chew    Quit date: 01/31/1989  . Alcohol Use: 7.2 oz/week    12 Standard drinks or equivalent per week  . Drug Use: No  . Sexual Activity: Yes   Other Topics Concern  . None   Social History Narrative   Divorced.     Review of Systems  Constitutional: Positive for appetite change. Negative for fever, chills, diaphoresis and activity change.  Musculoskeletal: Positive for myalgias, back pain, joint swelling and arthralgias. Negative for gait problem.  Skin: Negative for color change, rash  and wound.  Neurological: Negative for weakness and numbness.  Hematological: Negative for adenopathy. Does not bruise/bleed easily.       Objective:  BP 138/78 mmHg  Pulse 71  Temp(Src) 98.5 F (36.9 C) (Oral)  Resp 16  Ht 5' 8.75" (1.746 m)  Wt 184 lb 9.6 oz (83.734 kg)  BMI 27.47 kg/m2  SpO2 97%  Physical Exam  Constitutional: He is oriented to person, place, and time. He appears well-developed and well-nourished. No distress.  HENT:  Head: Normocephalic and atraumatic.  Eyes: Conjunctivae are normal. Pupils are equal, round, and reactive to light. No scleral icterus.  Neck: Normal range of motion. Neck supple. No thyromegaly present.  Cardiovascular: Normal rate,  regular rhythm, normal heart sounds and intact distal pulses.   Pulmonary/Chest: Effort normal and breath sounds normal. No respiratory distress.  Musculoskeletal: He exhibits no edema.  Lymphadenopathy:    He has no cervical adenopathy.  Neurological: He is alert and oriented to person, place, and time.  Skin: Skin is warm and dry. He is not diaphoretic.  Psychiatric: He has a normal mood and affect. His behavior is normal.   Results for orders placed or performed in visit on 09/12/15  POCT urinalysis dipstick  Result Value Ref Range   Color, UA yellow yellow   Clarity, UA clear clear   Glucose, UA negative negative   Bilirubin, UA negative negative   Ketones, POC UA negative negative   Spec Grav, UA 1.025    Blood, UA small (A) negative   pH, UA 5.5    Protein Ur, POC negative negative   Urobilinogen, UA 0.2    Nitrite, UA Negative Negative   Leukocytes, UA Negative Negative  POCT Microscopic Urinalysis ([facility])  Result Value Ref Range   WBC,UR,HPF,POC None None WBC/hpf   RBC,UR,HPF,POC Few (A) None RBC/hpf   Bacteria None None, Too numerous to count   Mucus Absent Absent   Epithelial Cells, UR Per Microscopy None None, Too numerous to count cells/hpf       Assessment & Plan:  sched cpe and meds refill in 6 mos. Microscopic hemnaturia flp if fasting 1. Chronic pain syndrome   2. Elevated blood pressure   3. Elevated cholesterol   4. Microscopic hematuria     Orders Placed This Encounter  Procedures  . POCT urinalysis dipstick  . POCT Microscopic Urinalysis ([facility])    Meds ordered this encounter  Medications  . cyclobenzaprine (FLEXERIL) 10 MG tablet    Sig: Take 1 tablet (10 mg total) by mouth 3 times/day as needed-between meals & bedtime for muscle spasms.    Dispense:  60 tablet    Refill:  5  . traMADol (ULTRAM) 50 MG tablet    Sig: Take 1-2 tablets (50-100 mg total) by mouth every 6 (six) hours as needed for moderate pain.    Dispense:  120 tablet     Refill:  5     Norberto SorensonEva Keandra Medero, MD MPH

## 2015-09-12 ENCOUNTER — Ambulatory Visit (INDEPENDENT_AMBULATORY_CARE_PROVIDER_SITE_OTHER): Payer: 59 | Admitting: Family Medicine

## 2015-09-12 ENCOUNTER — Encounter: Payer: Self-pay | Admitting: Family Medicine

## 2015-09-12 VITALS — BP 138/78 | HR 71 | Temp 98.5°F | Resp 16 | Ht 68.75 in | Wt 184.6 lb

## 2015-09-12 DIAGNOSIS — R030 Elevated blood-pressure reading, without diagnosis of hypertension: Secondary | ICD-10-CM | POA: Diagnosis not present

## 2015-09-12 DIAGNOSIS — G894 Chronic pain syndrome: Secondary | ICD-10-CM

## 2015-09-12 DIAGNOSIS — R3129 Other microscopic hematuria: Secondary | ICD-10-CM | POA: Diagnosis not present

## 2015-09-12 DIAGNOSIS — E7800 Pure hypercholesterolemia, unspecified: Secondary | ICD-10-CM

## 2015-09-12 DIAGNOSIS — IMO0001 Reserved for inherently not codable concepts without codable children: Secondary | ICD-10-CM

## 2015-09-12 LAB — POC MICROSCOPIC URINALYSIS (UMFC): Mucus: ABSENT

## 2015-09-12 LAB — POCT URINALYSIS DIP (MANUAL ENTRY)
BILIRUBIN UA: NEGATIVE
GLUCOSE UA: NEGATIVE
Ketones, POC UA: NEGATIVE
Leukocytes, UA: NEGATIVE
Nitrite, UA: NEGATIVE
Protein Ur, POC: NEGATIVE
SPEC GRAV UA: 1.025
Urobilinogen, UA: 0.2
pH, UA: 5.5

## 2015-09-12 MED ORDER — TRAMADOL HCL 50 MG PO TABS: 50.0000 mg | ORAL_TABLET | Freq: Four times a day (QID) | ORAL | Status: DC | PRN

## 2015-09-12 MED ORDER — CYCLOBENZAPRINE HCL 10 MG PO TABS: 10.0000 mg | ORAL_TABLET | Freq: Two times a day (BID) | ORAL | Status: DC | PRN

## 2015-09-21 ENCOUNTER — Telehealth: Payer: Self-pay

## 2015-09-21 NOTE — Telephone Encounter (Signed)
-----   Message from [name], MD sent at 09/16/2015 10:35 AM EDT ----- Jay Davidson still has a small amount of blood in his urine as he did 6 mos prior as well so I would recommend urology evaluation.  I see he has a history of kidney stones so this is most likely just incidental from concentrated urine or possible that he could have developed another small stone but persistent blood in the urine does need evaluation to ensure there is no chronic inflammation or cancer as the cause.  He definitely needs to drink more water so if he wants to try that for the next 6 months and then recheck at his full physical (and be well hydrated for the several days prior) I think that would be reasonable prior to urology evaluation but if he just wants to proceed will be happy to place referral.

## 2015-09-21 NOTE — Telephone Encounter (Signed)
LMOVM for pt to return call to clinic to discuss lab results. 

## 2015-12-25 ENCOUNTER — Ambulatory Visit (INDEPENDENT_AMBULATORY_CARE_PROVIDER_SITE_OTHER): Payer: 59

## 2015-12-25 ENCOUNTER — Ambulatory Visit (INDEPENDENT_AMBULATORY_CARE_PROVIDER_SITE_OTHER): Payer: 59 | Admitting: Physician Assistant

## 2015-12-25 VITALS — BP 122/72 | HR 77 | Temp 97.7°F | Resp 17 | Ht 69.5 in | Wt 189.0 lb

## 2015-12-25 DIAGNOSIS — M25511 Pain in right shoulder: Secondary | ICD-10-CM

## 2015-12-25 MED ORDER — MELOXICAM 7.5 MG PO TABS: 7.5000 mg | ORAL_TABLET | Freq: Every day | ORAL | 0 refills | Status: DC | PRN

## 2015-12-25 NOTE — Progress Notes (Signed)
QUINT CHESTNUT  MRN: [id number] DOB: 1955/09/25  Subjective:  Jay Davidson is a 60 y.o. male seen in office today for a chief complaint of right shoulder pain x 1 day. Pt was playing tennis last night and dove for the tennis ball, landing directly on posterior right shoulder. Pt was able to continue to play after injury.  Notes that it did not hurt immediately but an hour after the injury, the pain began and it has been progressively getting worse.Has associated decreased ROM. Denies hearing/feeling popping sensation, history of right shoulder injury, numbness, tingling, and loss of sensation. Has not tried anything for the pain. 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   Review of Systems  All other systems reviewed and are negative.   Patient Active Problem List   Diagnosis Date Noted  . Hip pain 09/11/2014  . Insomnia 09/11/2014  . Elevated cholesterol 09/11/2014  . Chronic pain syndrome 09/11/2014  . Elevated blood pressure 09/11/2014  . 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 Osteoarthritis of hip 04/21/2011  . 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 RIGHT BUNDLE BRANCH BLOCK 11/01/2009    Current Outpatient Prescriptions on File Prior to Visit  Medication Sig Dispense Refill  . aspirin 81 MG chewable tablet Chew 81 mg by mouth daily.    . cyclobenzaprine (FLEXERIL) 10 MG tablet Take 1 tablet (10 mg total) by mouth 3 times/day as needed-between meals & bedtime for muscle spasms. 60 tablet 5  . Multiple Vitamins-Minerals (MULTIVITAMIN WITH MINERALS) tablet Take 1 tablet by mouth daily.    . traMADol (ULTRAM) 50 MG tablet Take 1-2 tablets (50-100 mg total) by mouth every 6 (six) hours as needed for moderate pain. 120 tablet 5   No current facility-administered medications on file prior to visit.     Allergies  Allergen Reactions  . Pollen Extract Other (See Comments)    Unspecified reaction    Objective:  BP 122/72 (BP Location:  Right Arm, Patient Position: Sitting, Cuff Size: Normal)   Pulse 77   Temp 97.7 F (36.5 C) (Oral)   Resp 17   Ht 5' 9.5" (1.765 m)   Wt 189 lb (85.7 kg)   SpO2 96%   BMI 27.51 kg/m   Physical Exam  Constitutional: He is oriented to person, place, and time and well-developed, well-nourished, and in no distress.  HENT:  Head: Normocephalic and atraumatic.  Eyes: Conjunctivae are normal.  Neck: Normal range of motion.  Pulmonary/Chest: Effort normal.  Musculoskeletal:       Right shoulder: He exhibits decreased range of motion (with abduction), tenderness (to palpation, most prounounced at [facility] joint) and decreased strength. He exhibits no swelling and no crepitus. Deformity: ecchymosis noted on posterior aspect of acromion.       Left shoulder: Normal.       Right elbow: Normal.      Left elbow: Normal.       Cervical back: Normal. He exhibits normal range of motion, no tenderness and no spasm.  Neurological: He is alert and oriented to person, place, and time. He has normal sensation. Gait normal.  Skin: Skin is warm and dry.  Psychiatric: Affect normal.  Vitals reviewed.  Dg Shoulder Right  Result Date: 12/25/2015 CLINICAL DATA:  Right shoulder pain for 1 day, possibly injured playing tennis EXAM: RIGHT SHOULDER - 2+ VIEW COMPARISON:  Right shoulder films of 03/29/2007 FINDINGS: No acute fracture is seen. The  humeral head is in normal position and the glenohumeral joint space is unremarkable. The right [facility] joint is normally aligned. IMPRESSION: No acute abnormality. Electronically Signed   By: Dwyane Dee M.D.   On: 12/25/2015 08:55    Assessment and Plan :  1. Acute shoulder pain, right - DG Shoulder Right; Future - meloxicam (MOBIC) 7.5 MG tablet; Take 1-2 tablets (7.5-15 mg total) by mouth daily as needed for pain.  Dispense: 60 tablet; Refill: 0 -Recommend ice 5-6 times a day for 15-20 minutes at a time to reduce pain and swelling. -Rest and a protective sling until pain  subsides. This usually takes about 1-2 weeks. -Begin range of motion exercises to restore normal motion and strength as soon as tolerated and progress as healing allows. -If no improvement in 2 weeks, return for possible referral to physical therapy   Benjiman Core PA-C  [facility] 12/25/2015 8:32 AM

## 2015-12-25 NOTE — Patient Instructions (Addendum)
Recommend ice 5-6 times a day for 15-20 minutes at a time to reduce pain and swelling. Rest and a protective sling until pain subsides. This usually takes about 1-2 weeks. Take mobic 1-2 pills once a day for the next two weeks. Use ultram as needed for sleep.  Begin range of motion exercises to restore normal motion and strength as soon as tolerated and progress as healing allows.  You are allowed to return to sports when there is full and painless range of motion, no more tenderness when the [facility] joint is touched, and manual traction does not cause pain. This usually takes about 2 weeks for a grade I injury  If no improvement in 2 weeks, return for possible referral to PT  IF you received an x-ray today, you will receive an invoice from [facility]. Please contact [facility] at [phone] with questions or concerns regarding your invoice.   IF you received labwork today, you will receive an invoice from [employer]. Please contact [facility] at [phone] with questions or concerns regarding your invoice.   Our billing staff will not be able to assist you with questions regarding bills from these companies.  You will be contacted with the lab results as soon as they are available. The fastest way to get your results is to activate your My Chart account. Instructions are located on the last page of this paperwork. If you have not heard from [place] regarding the results in 2 weeks, please contact this office.

## 2016-01-01 ENCOUNTER — Telehealth: Payer: Self-pay

## 2016-01-01 NOTE — Telephone Encounter (Signed)
Patient needs his FMLA forms completed by [place] for his OV on 12/25/15 about his right shoulder injury. I have completed the forms from the OV notes and highlighted the areas that need to be signed. If you could please complete these forms and return them to the FMLA/Disability box at the [address] checkout desk within 5-7 business days. Thank you!

## 2016-01-06 NOTE — Telephone Encounter (Signed)
FMLA forms scanned and faxed to number provided on 01/06/16

## 2016-01-14 DIAGNOSIS — Z0271 Encounter for disability determination: Secondary | ICD-10-CM

## 2016-03-11 NOTE — Progress Notes (Signed)
Subjective:    Patient ID: Jay Davidson, male    DOB: 25-Mar-1956, 60 y.o.   MRN: [id number] Chief Complaint  Patient presents with  . Hypertension    f/u 6 mos    HPI  Jay Davidson is a delightful 60 yo male here for a 6 mo follow-up on his chronic medical conditions  Chronic pain syndrome: Uses flexeril prn, usually qhs. Uses tramadol 4 tabs qd.  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  Elevated BP: checking outside office and is 120-130/70, occ 110.  HPL: prior ASCVD risk 6.5% so work on tlc.  Has not been eating much meat.  He does have congentially high chol and he had a detailed lipoprofile break down which was reassuring - they put him on red yeast rice which didn't really help.  Is always high.  Eats a lot of fish and takes fish oil occ.   Microscopic hematuria: h/o kidney stones, recommended urology eval  Past Medical History:  Diagnosis Date  . Asymptomatic cholelithiasis    chronic  . At risk for sleep apnea    STOP-BANG= 4      PT HAS NO PCP  . History of diverticulitis of colon   . Incomplete RBBB   . Left ureteral calculus   . OA (osteoarthritis)    Past Surgical History:  Procedure Laterality Date  . CYSTOSCOPY WITH URETEROSCOPY AND STENT PLACEMENT Left 02/02/2014   Procedure: CYSTOSCOPY WITH LEFT RETRGRADE, URETEROSCOPY AND STENT PLACEMENT;  Surgeon: Garnett Farm, MD;  Location: [facility];  Service: Urology;  Laterality: Left;  . ELBOW SURGERY Right 2001  . HOLMIUM LASER APPLICATION Left 02/02/2014   Procedure: HOLMIUM LASER APPLICATION;  Surgeon: Garnett Farm, MD;  Location: [facility];  Service: Urology;  Laterality: Left;  . KNEE ARTHROSCOPY Bilateral left 1984/   right 1996  . LEFT ELBOW RECONSTRUCTION COMMON EXTENSOR, LATERAL EPICONDYLE  01-24-2002  . TOTAL HIP ARTHROPLASTY Right 10-07-2004  . TOTAL HIP REVISION Right 2012  . TRANSTHORACIC ECHOCARDIOGRAM  11-26-2009   grade II diastolic dysfunction/   ef 55-60%/  mild AR & MR/  mild aortic sclerosis without stenosis   Current Outpatient Prescriptions on File Prior to Visit  Medication Sig Dispense Refill  . aspirin 81 MG chewable tablet Chew 81 mg by mouth daily.    . cyclobenzaprine (FLEXERIL) 10 MG tablet Take 1 tablet (10 mg total) by mouth 3 times/day as needed-between meals & bedtime for muscle spasms. 60 tablet 5  . Multiple Vitamins-Minerals (MULTIVITAMIN WITH MINERALS) tablet Take 1 tablet by mouth daily.     No current facility-administered medications on file prior to visit.    Allergies  Allergen Reactions  . Pollen Extract Other (See Comments)    Unspecified reaction   Family History  Problem Relation Age of Onset  . Hypertension Mother   . Mental illness Father    Social History   Social History  . Marital status: Married    Spouse name: N/A  . Number of children: N/A  . Years of education: N/A   Occupational History  . 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 Smoking status: Former Smoker    Packs/day: 1.00    Years: 20.00    Types: Cigarettes    Quit date: 01/31/1989  . Smokeless tobacco: Former Neurosurgeon    Types: Snuff, Chew    Quit date: 01/31/1989  . Alcohol use 7.2  oz/week    12 Standard drinks or equivalent per week  . Drug use: No  . Sexual activity: Yes   Other Topics Concern  . None   Social History Narrative   Divorced.     Review of Systems  Constitutional: Negative for chills and fever.  Gastrointestinal: Negative for abdominal pain, constipation and diarrhea.  Genitourinary: Negative for decreased urine volume, difficulty urinating, frequency and urgency.  Musculoskeletal: Positive for arthralgias and back pain. Negative for gait problem and joint swelling.  Skin: Negative for color change and wound.  Neurological: Negative for dizziness, weakness, light-headedness and numbness.       Objective:   Physical Exam  Constitutional: He is oriented to person, place, and  time. He appears well-developed and well-nourished. No distress.  HENT:  Head: Normocephalic and atraumatic.  Eyes: Conjunctivae are normal. Pupils are equal, round, and reactive to light. No scleral icterus.  Neck: Normal range of motion. Neck supple. No thyromegaly present.  Cardiovascular: Normal rate, regular rhythm, normal heart sounds and intact distal pulses.   Pulmonary/Chest: Effort normal and breath sounds normal. No respiratory distress.  Musculoskeletal: He exhibits no edema.  Lymphadenopathy:    He has no cervical adenopathy.  Neurological: He is alert and oriented to person, place, and time.  Skin: Skin is warm and dry. He is not diaphoretic.  Psychiatric: He has a normal mood and affect. His behavior is normal.     BP 130/80   Pulse 85   Temp 98.3 F (36.8 C) (Oral)   Resp 16   Ht 5' 8.75" (1.746 m)   Wt 181 lb 9.6 oz (82.4 kg)   SpO2 96%   BMI 27.01 kg/m       Assessment & Plan:  Sched CPE and med refills in 6 mos  Psa??  1. Chronic pain syndrome - doing well, refilled chronic meds  2. Elevated cholesterol   3. Microscopic hematuria   4. Flu vaccine need   5. Elevated blood pressure reading   6. Medication monitoring encounter   7. Needs flu shot     Orders Placed This Encounter  Procedures  . Flu Vaccine QUAD 36+ mos IM  . Lipid panel    Order Specific Question:   Has the patient fasted?    Answer:   Yes  . Comprehensive metabolic panel    Order Specific Question:   Has the patient fasted?    Answer:   Yes  . Urine Microscopic  . POCT urinalysis dipstick    Meds ordered this encounter  Medications  . traMADol (ULTRAM) 50 MG tablet    Sig: Take 1-2 tablets (50-100 mg total) by mouth every 6 (six) hours as needed for moderate pain.    Dispense:  120 tablet    Refill:  5     Norberto SorensonEva Aking Klabunde, M.D.  [facility]  [place] [address] [place], [place] [id number] [phone] phone [phone] fax  03/16/16 1:56 AM

## 2016-03-12 ENCOUNTER — Ambulatory Visit (INDEPENDENT_AMBULATORY_CARE_PROVIDER_SITE_OTHER): Payer: 59 | Admitting: Family Medicine

## 2016-03-12 ENCOUNTER — Encounter: Payer: Self-pay | Admitting: Family Medicine

## 2016-03-12 VITALS — BP 130/80 | HR 85 | Temp 98.3°F | Resp 16 | Ht 68.75 in | Wt 181.6 lb

## 2016-03-12 DIAGNOSIS — R030 Elevated blood-pressure reading, without diagnosis of hypertension: Secondary | ICD-10-CM

## 2016-03-12 DIAGNOSIS — R3129 Other microscopic hematuria: Secondary | ICD-10-CM | POA: Diagnosis not present

## 2016-03-12 DIAGNOSIS — Z23 Encounter for immunization: Secondary | ICD-10-CM

## 2016-03-12 DIAGNOSIS — E7800 Pure hypercholesterolemia, unspecified: Secondary | ICD-10-CM | POA: Diagnosis not present

## 2016-03-12 DIAGNOSIS — Z5181 Encounter for therapeutic drug level monitoring: Secondary | ICD-10-CM

## 2016-03-12 DIAGNOSIS — G894 Chronic pain syndrome: Secondary | ICD-10-CM | POA: Diagnosis not present

## 2016-03-12 LAB — POCT URINALYSIS DIP (MANUAL ENTRY)
GLUCOSE UA: NEGATIVE
Leukocytes, UA: NEGATIVE
NITRITE UA: NEGATIVE
Protein Ur, POC: NEGATIVE
RBC UA: NEGATIVE
SPEC GRAV UA: 1.02
UROBILINOGEN UA: 1
pH, UA: 7

## 2016-03-12 MED ORDER — TRAMADOL HCL 50 MG PO TABS: 50.0000 mg | ORAL_TABLET | Freq: Four times a day (QID) | ORAL | 5 refills | Status: DC | PRN

## 2016-03-12 NOTE — Patient Instructions (Signed)
   IF you received an x-ray today, you will receive an invoice from [facility]. Please contact [facility] at [phone] with questions or concerns regarding your invoice.   IF you received labwork today, you will receive an invoice from [facility]. Please contact [facility] at [phone] with questions or concerns regarding your invoice.   Our billing staff will not be able to assist you with questions regarding bills from these companies.  You will be contacted with the lab results as soon as they are available. The fastest way to get your results is to activate your My Chart account. Instructions are located on the last page of this paperwork. If you have not heard from us regarding the results in 2 weeks, please contact this office.    Influenza (Flu) Vaccine (Inactivated or Recombinant):  1. Why get vaccinated? Influenza ("flu") is a contagious disease that spreads around the [place] every year, usually between October and May. Flu is caused by influenza viruses, and is spread mainly by coughing, sneezing, and close contact. Anyone can get flu. Flu strikes suddenly and can last several days. Symptoms vary by age, but can include:  fever/chills  sore throat  muscle aches  fatigue  cough  headache  runny or stuffy nose Flu can also lead to pneumonia and blood infections, and cause diarrhea and seizures in children. If you have a medical condition, such as heart or lung disease, flu can make it worse. Flu is more dangerous for some people. Infants and young children, people 65 years of age and older, pregnant women, and people with certain health conditions or a weakened immune system are at greatest risk. Each year thousands of people in the [place] die from flu, and many more are hospitalized. Flu vaccine can:  keep you from getting flu,  make flu less severe if you do get it, and  keep you from spreading flu to  your family and other people. 2. Inactivated and recombinant flu vaccines A dose of flu vaccine is recommended every flu season. Children 6 months through 8 years of age may need two doses during the same flu season. Everyone else needs only one dose each flu season. Some inactivated flu vaccines contain a very small amount of a mercury-based preservative called thimerosal. Studies have not shown thimerosal in vaccines to be harmful, but flu vaccines that do not contain thimerosal are available. There is no live flu virus in flu shots. They cannot cause the flu. There are many flu viruses, and they are always changing. Each year a new flu vaccine is made to protect against three or four viruses that are likely to cause disease in the upcoming flu season. But even when the vaccine doesn't exactly match these viruses, it may still provide some protection. Flu vaccine cannot prevent:  flu that is caused by a virus not covered by the vaccine, or  illnesses that look like flu but are not. It takes about 2 weeks for protection to develop after vaccination, and protection lasts through the flu season. 3. Some people should not get this vaccine Tell the person who is giving you the vaccine:  If you have any severe, life-threatening allergies. If you ever had a life-threatening allergic reaction after a dose of flu vaccine, or have a severe allergy to any part of this vaccine, you may be advised not to get vaccinated. Most, but not all, types of flu vaccine contain a small amount of egg protein.    If you ever had Guillain-Barre Syndrome (also called GBS). Some people with a history of GBS should not get this vaccine. This should be discussed with your doctor.  If you are not feeling well. It is usually okay to get flu vaccine when you have a mild illness, but you might be asked to come back when you feel better. 4. Risks of a vaccine reaction With any medicine, including vaccines, there is a chance of  reactions. These are usually mild and go away on their own, but serious reactions are also possible. Most people who get a flu shot do not have any problems with it. Minor problems following a flu shot include:  soreness, redness, or swelling where the shot was given  hoarseness  sore, red or itchy eyes  cough  fever  aches  headache  itching  fatigue If these problems occur, they usually begin soon after the shot and last 1 or 2 days. More serious problems following a flu shot can include the following:  There may be a small increased risk of Guillain-Barre Syndrome (GBS) after inactivated flu vaccine. This risk has been estimated at 1 or 2 additional cases per million people vaccinated. This is much lower than the risk of severe complications from flu, which can be prevented by flu vaccine.  Young children who get the flu shot along with pneumococcal vaccine (PCV13) and/or DTaP vaccine at the same time might be slightly more likely to have a seizure caused by fever. Ask your doctor for more information. Tell your doctor if a child who is getting flu vaccine has ever had a seizure. Problems that could happen after any injected vaccine:  People sometimes faint after a medical procedure, including vaccination. Sitting or lying down for about 15 minutes can help prevent fainting, and injuries caused by a fall. Tell your doctor if you feel dizzy, or have vision changes or ringing in the ears.  Some people get severe pain in the shoulder and have difficulty moving the arm where a shot was given. This happens very rarely.  Any medication can cause a severe allergic reaction. Such reactions from a vaccine are very rare, estimated at about 1 in a million doses, and would happen within a few minutes to a few hours after the vaccination. As with any medicine, there is a very remote chance of a vaccine causing a serious injury or death. The safety of vaccines is always being monitored. For  more information, visit: www.cdc.gov/vaccinesafety/ 5. What if there is a serious reaction? What should I look for?  Look for anything that concerns you, such as signs of a severe allergic reaction, very high fever, or unusual behavior. Signs of a severe allergic reaction can include hives, swelling of the face and throat, difficulty breathing, a fast heartbeat, dizziness, and weakness. These would start a few minutes to a few hours after the vaccination. What should I do?  If you think it is a severe allergic reaction or other emergency that can't wait, call 9-1-1 and get the person to the nearest hospital. Otherwise, call your doctor.  Reactions should be reported to the Vaccine Adverse Event Reporting System (VAERS). Your doctor should file this report, or you can do it yourself through the VAERS web site at www.vaers.hhs.gov, or by calling 1-800-822-7967. VAERS does not give medical advice. 6. The National Vaccine Injury Compensation Program The National Vaccine Injury Compensation Program (VICP) is a federal program that was created to compensate people who may have been   injured by certain vaccines. Persons who believe they may have been injured by a vaccine can learn about the program and about filing a claim by calling 1-800-338-2382 or visiting the VICP website at www.hrsa.gov/vaccinecompensation. There is a time limit to file a claim for compensation. 7. How can I learn more?  Ask your healthcare provider. He or she can give you the vaccine package insert or suggest other sources of information.  Call your local or state health department.  Contact the Centers for Disease Control and Prevention (CDC):  Call 1-800-232-4636 (1-800-CDC-INFO) or  Visit CDC's website at www.cdc.gov/flu Vaccine Information Statement Inactivated Influenza Vaccine (12/29/2013)   This information is not intended to replace advice given to you by your health care provider. Make sure you discuss any  questions you have with your health care provider.   Document Released: 03/05/2006 Document Revised: 06/01/2014 Document Reviewed: 01/01/2014 Elsevier Interactive Patient Education 2016 Elsevier Inc.  

## 2016-03-13 LAB — LIPID PANEL
Cholesterol: 242 mg/dL — ABNORMAL HIGH (ref 125–200)
HDL: 76 mg/dL (ref >=40)
LDL CALC: 156 mg/dL — AB (ref <130)
Total CHOL/HDL Ratio: 3.2 Ratio (ref <=5.0)
Triglycerides: 49 mg/dL (ref <150)
VLDL: 10 mg/dL (ref <30)

## 2016-03-13 LAB — COMPREHENSIVE METABOLIC PANEL
ALT: 17 U/L (ref 9–46)
AST: 28 U/L (ref 10–35)
Albumin: 4.4 g/dL (ref 3.6–5.1)
Alkaline Phosphatase: 53 U/L (ref 40–115)
BUN: 15 mg/dL (ref 7–25)
CHLORIDE: 104 mmol/L (ref 98–110)
CO2: 29 mmol/L (ref 20–31)
CREATININE: 0.89 mg/dL (ref 0.70–1.33)
Calcium: 9.2 mg/dL (ref 8.6–10.3)
GLUCOSE: 108 mg/dL — AB (ref 65–99)
Potassium: 5.5 mmol/L — ABNORMAL HIGH (ref 3.5–5.3)
SODIUM: 139 mmol/L (ref 135–146)
TOTAL PROTEIN: 6.9 g/dL (ref 6.1–8.1)
Total Bilirubin: 1.1 mg/dL (ref 0.2–1.2)

## 2016-03-13 LAB — URINALYSIS, MICROSCOPIC ONLY
Bacteria, UA: NONE SEEN [HPF]
CASTS: NONE SEEN [LPF]
CRYSTALS: NONE SEEN [HPF]
Squamous Epithelial / LPF: NONE SEEN [HPF] (ref <=5)
WBC UA: NONE SEEN WBC/HPF (ref <=5)
YEAST: NONE SEEN [HPF]

## 2016-06-20 IMAGING — CT CT ABD-PELV W/O CM
3 of 4 series · 9 of 46 positions shown, 16 images · non-contrast
Comparison: CT Abdomen and Pelvis 02/22/2013.

CLINICAL DATA: 57-year-old male with left flank pain for 10 days.
Initial encounter.

EXAM:
CT ABDOMEN AND PELVIS WITHOUT CONTRAST
TECHNIQUE: Multidetector CT imaging of the abdomen and pelvis was performed
following the standard protocol without IV contrast.

[Series 3: lung windows · axial · 0.73mm/px · z∈[-95,-20]mm · 5 of 23 slices shown, 10 images]
[im 4/23  soft-tissue]
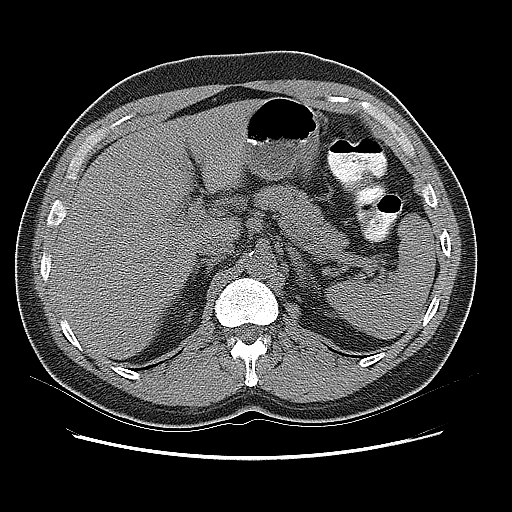
[im 4/23  bone]
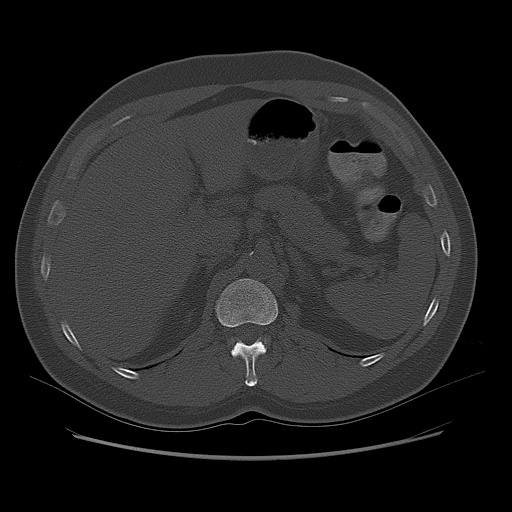
[im 8/23  soft-tissue]
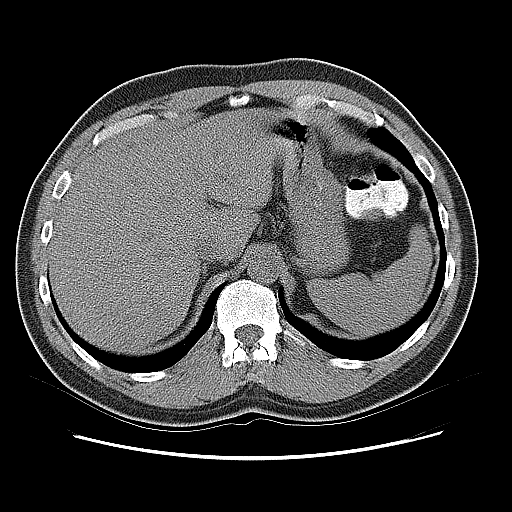
[im 8/23  lung]
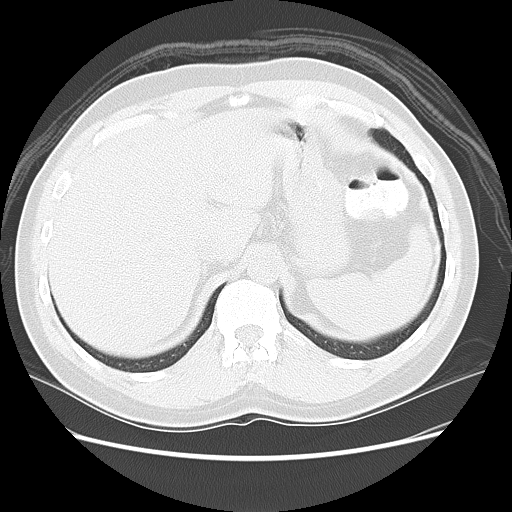
[im 12/23  soft-tissue]
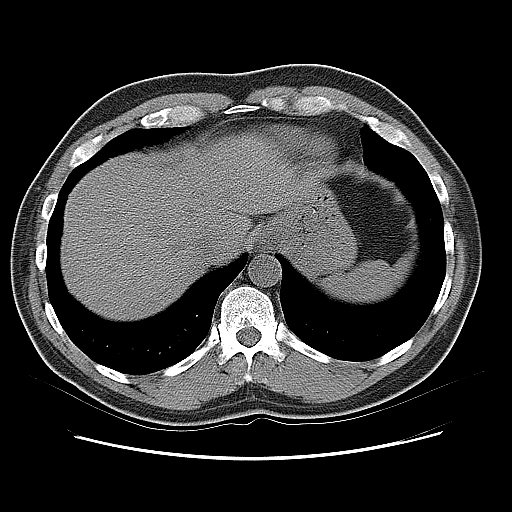
[im 12/23  lung]
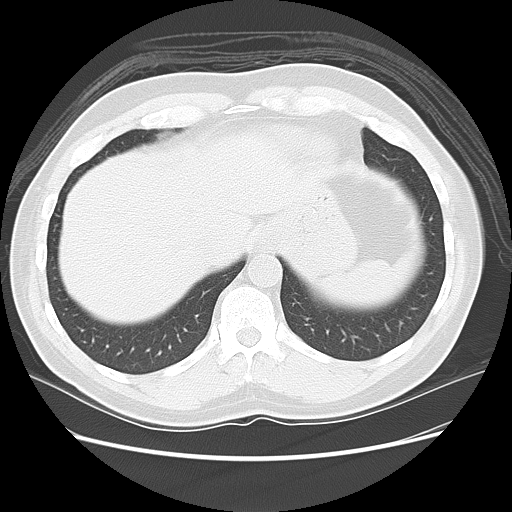
[im 15/23  soft-tissue]
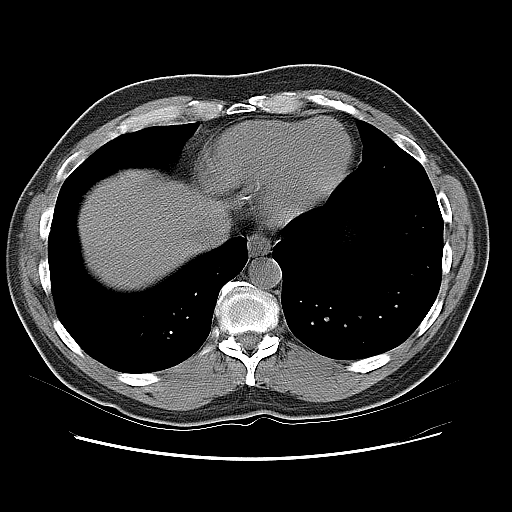
[im 15/23  lung]
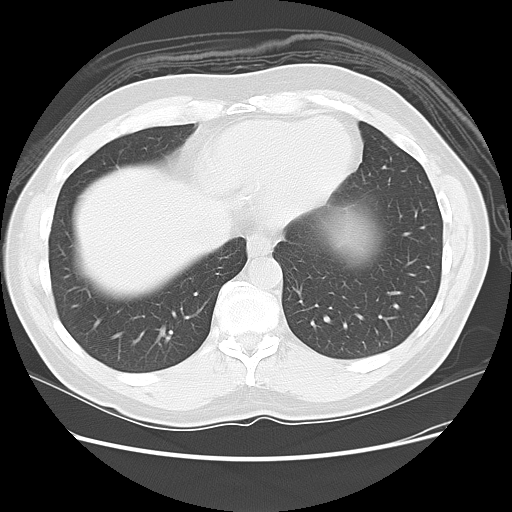
[im 19/23  soft-tissue]
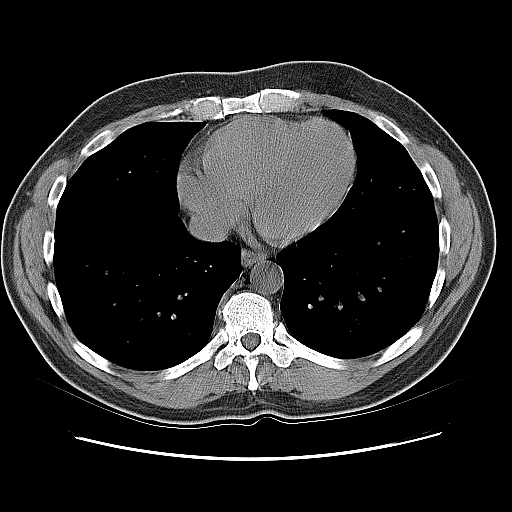
[im 19/23  lung]
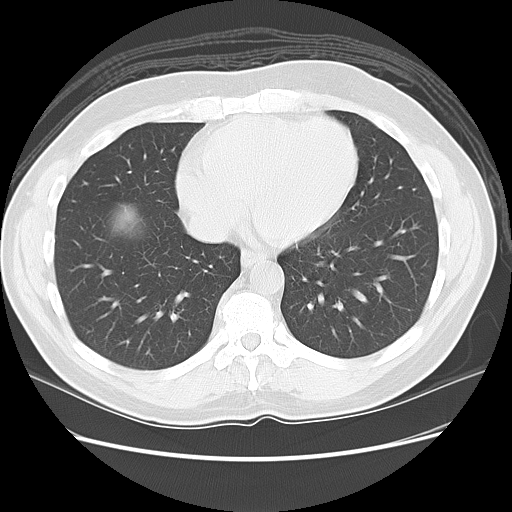

[Series 400: cor · coronal · 0.89mm/px · 3 of 125 slices shown, 4 images]
[im 42/125  soft-tissue]
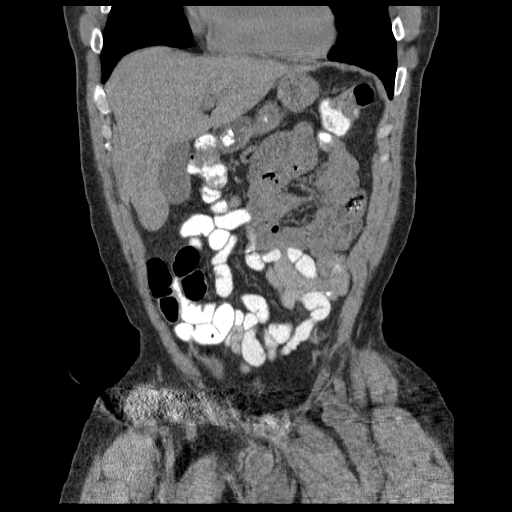
[im 56/125  soft-tissue]
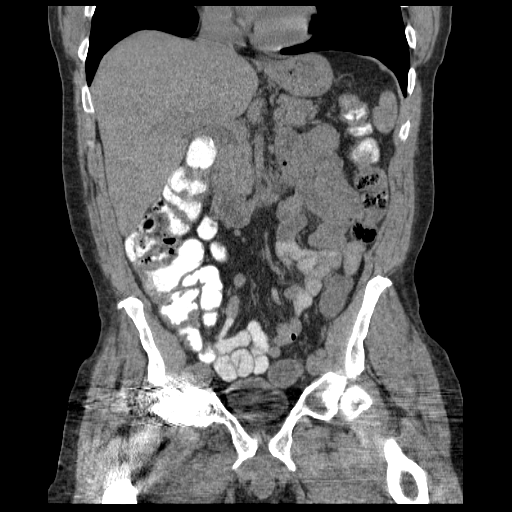
[im 56/125  bone]
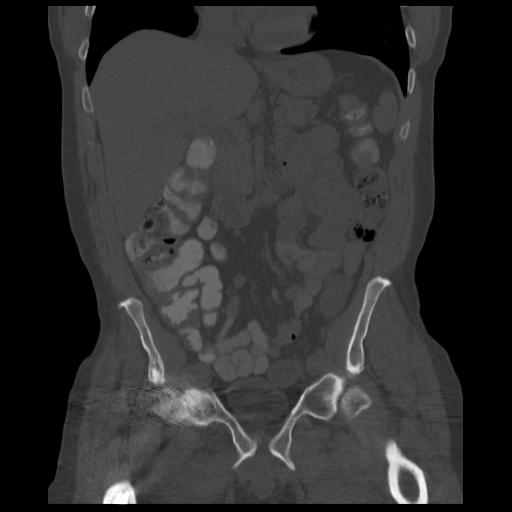
[im 69/125  soft-tissue]
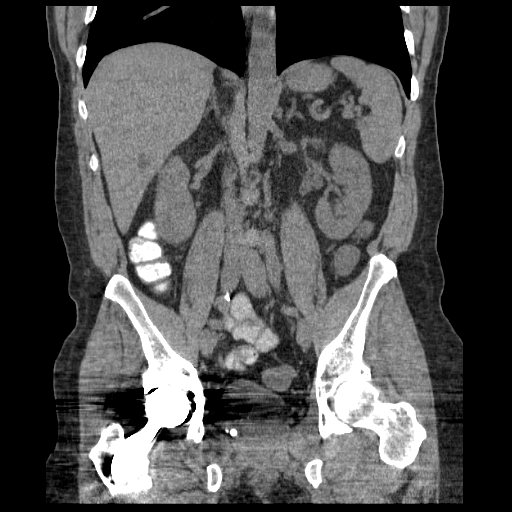

[Series 401: sag · sagittal · 0.89mm/px · 1 of 143 slices shown, 2 images]
[im 48/143  soft-tissue]
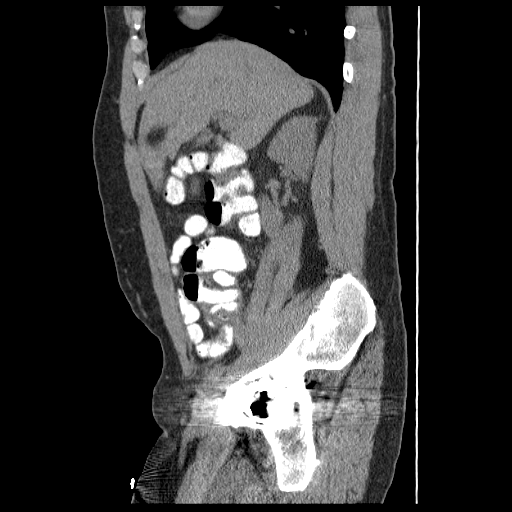
[im 48/143  bone]
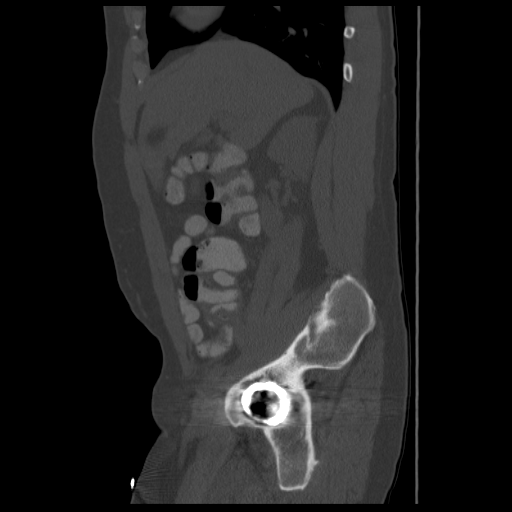

[9 of 46 positions shown; findings below may reference images not displayed]

FINDINGS: Negative lung bases.  No pericardial or pleural effusion.

No acute osseous abnormality identified. sequelae of right hip
arthroplasty.

No pelvic free fluid. There is asymmetry of the scrotum, partially
visible. This may be related to right unilateral versus bilateral
hydrocele (series 2, image 85).

Decompressed rectum. Negative sigmoid, left colon (contrast at the
splenic flexure), transverse colon, right colon and appendix.
Negative terminal ileum. No dilated small bowel. Decompressed
stomach and duodenum.

Cholelithiasis. No pericholecystic inflammation. Small chronic
low-density area in the right hepatic lobe is unchanged measuring 9
mm, favor benign cyst.

Negative non contrast spleen, pancreas, and adrenal glands.
Aortoiliac calcified atherosclerosis noted.

Negative right kidney and right ureter.

Mild asymmetry of the left renal collecting system compatible with
mild hydronephrosis. Mild left hydroureter and very ureteral
stranding. The left ureter appears inflamed to the level of the left
pelvic sidewall where on the elongated 4 x 7 mm calculus is new.
There are superimposed chronic left pelvic phleboliths which are
stable. The obstructing calculus today is located about 2 cm
proximal to the left ureterovesical junction. No left intra renal
calculi.

Streak artifact in the pelvis limits visualization of the bladder
otherwise. No abdominal free fluid.
IMPRESSION: 1. Acute obstructive uropathy on the left with a 7 x 4 mm calculus
located 2 cm proximal to the left UVJ.
2. Asymmetry of the visible scrotum, suspicious for hydroceles,
which may be the sequelae of prior trauma or inflammation.
3. Chronic cholelithiasis.

## 2016-08-26 NOTE — Assessment & Plan Note (Signed)
Checks regularly outside office and remains well controlled at 110-130/70.

## 2016-08-26 NOTE — Progress Notes (Signed)
Subjective:    Patient ID: Jay Davidson, male    DOB: 1956/02/24, 61 y.o.   MRN: [id number] Chief Complaint  Patient presents with  . Follow-up    6 mos Cholesterol, BP    HPI  Jay Davidson is a delightful 61 yo male here for a 6 mo follow-up on his chronic medical conditions  Chronic pain syndrome: Uses flexeril prn, usually qhs. Uses tramadol 4 tabs qd and has been steady for years.  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  He is started living weights some more.   Elevated BP: checking outside office and is 110-130/70, occ 140. Has not been checking outside of the office.    HPL: prior ASCVD risk 6.5% ->___ (recalc with levels 6 mos ago) with work on tlc. Has not been eating hardly any red meat. He does have congentially high chol and he had a detailed lipoprofile break down which was reassuring - they put him on red yeast rice which didn't really help.  Is always high.  Eats a lot of fish and takes fish oil occ. At last checked LDL had decreased from 165 -> 156 with non-HDL decreasing from 178 -> 166.  He was a vegetarian for 15 yrs so really minimal meat.  His brothers are on cholesterol medication so he is willing to start whenever he needs to.   Microscopic hematuria resolved at last check 6 mos ago : h/o kidney stones, Saw urology Dr. Vernie Ammons on 01/18/14.   Elevated fasting glucose: low 100s  Past Medical History:  Diagnosis Date  . Asymptomatic cholelithiasis    chronic  . At risk for sleep apnea    STOP-BANG= 4      PT HAS NO PCP  . History of diverticulitis of colon   . Incomplete RBBB   . Left ureteral calculus   . OA (osteoarthritis)    Past Surgical History:  Procedure Laterality Date  . CYSTOSCOPY WITH URETEROSCOPY AND STENT PLACEMENT Left 02/02/2014   Procedure: CYSTOSCOPY WITH LEFT RETRGRADE, URETEROSCOPY AND STENT PLACEMENT;  Surgeon: Garnett Farm, MD;  Location: [facility];  Service: Urology;  Laterality: Left;  . ELBOW SURGERY Right 2001  . HOLMIUM LASER APPLICATION Left 02/02/2014   Procedure: HOLMIUM LASER APPLICATION;  Surgeon: Garnett Farm, MD;  Location: [facility];  Service: Urology;  Laterality: Left;  . KNEE ARTHROSCOPY Bilateral left 1984/   right 1996  . LEFT ELBOW RECONSTRUCTION COMMON EXTENSOR, LATERAL EPICONDYLE  01-24-2002  . TOTAL HIP ARTHROPLASTY Right 10-07-2004  . TOTAL HIP REVISION Right 2012  . TRANSTHORACIC ECHOCARDIOGRAM  11-26-2009   grade II diastolic dysfunction/  ef 55-60%/  mild AR & MR/  mild aortic sclerosis without stenosis   Current Outpatient Prescriptions on File Prior to Visit  Medication Sig Dispense Refill  . aspirin 81 MG chewable tablet Chew 81 mg by mouth daily.    . Multiple Vitamins-Minerals (MULTIVITAMIN WITH MINERALS) tablet Take 1 tablet by mouth daily.     No current facility-administered medications on file prior to visit.    Allergies  Allergen Reactions  . Pollen Extract Other (See Comments)    Unspecified reaction   Family History  Problem Relation Age of Onset  . Hypertension Mother   . Mental illness Father    Social History   Social History  . Marital status: Married    Spouse name: N/A  .  Number of children: N/A  . Years of education: N/A   Occupational History  . 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 Smoking status: Former Smoker    Packs/day: 1.00    Years: 20.00    Types: Cigarettes    Quit date: 01/31/1989  . Smokeless tobacco: Former Neurosurgeon    Types: Snuff, Chew    Quit date: 01/31/1989  . Alcohol use 7.2 oz/week    12 Standard drinks or equivalent per week  . Drug use: No  . Sexual activity: Yes   Other Topics Concern  . None   Social History Narrative   Divorced.   Depression screen [facility] 2/9 08/27/2016 03/12/2016 12/25/2015 09/12/2015 01/11/2015  Decreased Interest 0 0 0 0 0  Down, Depressed, Hopeless 0 0 0 0 0  PHQ - 2 Score 0 0 0 0 0      Review of Systems  Constitutional: Positive for activity change (increased). Negative for appetite change, chills, diaphoresis, fatigue, fever and unexpected weight change.  Respiratory: Negative for shortness of breath.   Cardiovascular: Negative for chest pain, palpitations and leg swelling.  Genitourinary: Negative for difficulty urinating and hematuria.  Musculoskeletal: Positive for arthralgias. Negative for back pain, gait problem and joint swelling.  Neurological: Negative for dizziness, syncope, facial asymmetry, weakness, light-headedness, numbness and headaches.  Hematological: Negative for adenopathy.       Objective:   Physical Exam  Constitutional: He is oriented to person, place, and time. He appears well-developed and well-nourished. No distress.  HENT:  Head: Normocephalic and atraumatic.  Eyes: Conjunctivae are normal. Pupils are equal, round, and reactive to light. No scleral icterus.  Neck: Normal range of motion. Neck supple. No thyromegaly present.  Cardiovascular: Normal rate, regular rhythm, normal heart sounds and intact distal pulses.   Pulmonary/Chest: Effort normal and breath sounds normal. No respiratory distress.  Musculoskeletal: He exhibits no edema.  Lymphadenopathy:    He has no cervical adenopathy.  Neurological: He is alert and oriented to person, place, and time.  Skin: Skin is warm and dry. He is not diaphoretic.  Psychiatric: He has a normal mood and affect. His behavior is normal.         BP 138/76   Pulse 69   Temp 99.1 F (37.3 C) (Oral)   Resp 16   Ht  (1.753 m)   Wt 186 lb 9.6 oz (84.6 kg)   SpO2 97%   BMI 27.56 kg/m  Repeat bp higher 145/80 Assessment & Plan:  Last CPE 02/2015 - sched next 6 mo f/u OV for CPE Needs PSA - not seeing urology Get hgba1c due to mildly elev glucose prior (very low suspicion as weight is good and pt is very active).  1. Elevated cholesterol - willing to start statin if recommended  2.  Elevated blood-pressure reading without diagnosis of hypertension - controlled outside the office to stay off bp meds  3. Chronic pain syndrome - has been stable on tramadol  qid for many years w/o increase - this allows him to remain active, walk for work and not retire (even though he is elegible), workout freq so likely having this level of pain control has allowed him to control his HTN and HLD with lifestyle.  Pt would like to try to wean down on tramadol if able in future.  4. Arthralgia of hip, unspecified laterality   5. Screening for prostate cancer   6. Elevated glucose     Orders  Placed This Encounter  Procedures  . Lipid panel    Order Specific Question:   Has the patient fasted?    Answer:   Yes  . Comprehensive metabolic panel    Order Specific Question:   Has the patient fasted?    Answer:   Yes  . PSA  . Hemoglobin A1c  . Care order/instruction:    AVS printed - let patient go!    Meds ordered this encounter  Medications  . Omega-3 Fatty Acids (FISH OIL) 1000 MG CAPS    Sig: Take by mouth.  . traMADol (ULTRAM) 50 MG tablet    Sig: Take 1-2 tablets (50-100 mg total) by mouth every 6 (six) hours as needed for moderate pain.    Dispense:  120 tablet    Refill:  5    Norberto Sorenson, M.D.  Primary Care at [facility] [address] [place], [place] [address] [phone] phone [phone] fax  08/28/16 4:06 AM

## 2016-08-26 NOTE — Assessment & Plan Note (Signed)
prior ASCVD risk 6.5% ->___ (recalc with levels 6 mos ago) with work on tlc. Has not been eating much meat. He does have congentially high chol and he had a detailed lipoprofile break down which was reassuring - they put him on red yeast rice which didn't really help but continues to get a lot of omega-3s through his diet. At last check 02/2016 LDL had decreased from 165 -> 156 with non-HDL decreasing from 178 -> 166

## 2016-08-27 ENCOUNTER — Ambulatory Visit (INDEPENDENT_AMBULATORY_CARE_PROVIDER_SITE_OTHER): Payer: 59 | Admitting: Family Medicine

## 2016-08-27 ENCOUNTER — Encounter: Payer: Self-pay | Admitting: Family Medicine

## 2016-08-27 VITALS — BP 138/76 | HR 69 | Temp 99.1°F | Resp 16 | Ht 69.0 in | Wt 186.6 lb

## 2016-08-27 DIAGNOSIS — R7309 Other abnormal glucose: Secondary | ICD-10-CM

## 2016-08-27 DIAGNOSIS — R030 Elevated blood-pressure reading, without diagnosis of hypertension: Secondary | ICD-10-CM

## 2016-08-27 DIAGNOSIS — G894 Chronic pain syndrome: Secondary | ICD-10-CM

## 2016-08-27 DIAGNOSIS — M25559 Pain in unspecified hip: Secondary | ICD-10-CM

## 2016-08-27 DIAGNOSIS — Z125 Encounter for screening for malignant neoplasm of prostate: Secondary | ICD-10-CM | POA: Diagnosis not present

## 2016-08-27 DIAGNOSIS — E7800 Pure hypercholesterolemia, unspecified: Secondary | ICD-10-CM

## 2016-08-27 MED ORDER — TRAMADOL HCL 50 MG PO TABS: 50.0000 mg | ORAL_TABLET | Freq: Four times a day (QID) | ORAL | 5 refills | Status: DC | PRN

## 2016-08-27 NOTE — Patient Instructions (Addendum)
   IF you received an x-ray today, you will receive an invoice from [facility]. Please contact [facility] at [phone] with questions or concerns regarding your invoice.   IF you received labwork today, you will receive an invoice from [facility]. Please contact [facility] at [phone] with questions or concerns regarding your invoice.   Our billing staff will not be able to assist you with questions regarding bills from these companies.  You will be contacted with the lab results as soon as they are available. The fastest way to get your results is to activate your My Chart account. Instructions are located on the last page of this paperwork. If you have not heard from us regarding the results in 2 weeks, please contact this office.        Why follow it? Research shows. . Those who follow the Mediterranean diet have a reduced risk of heart disease  . The diet is associated with a reduced incidence of Parkinson's and Alzheimer's diseases . People following the diet may have longer life expectancies and lower rates of chronic diseases  . The Dietary Guidelines for Americans recommends the Mediterranean diet as an eating plan to promote health and prevent disease  What Is the Mediterranean Diet?  . Healthy eating plan based on typical foods and recipes of Mediterranean-style cooking . The diet is primarily a plant based diet; these foods should make up a majority of meals   Starches - Plant based foods should make up a majority of meals - They are an important sources of vitamins, minerals, energy, antioxidants, and fiber - Choose whole grains, foods high in fiber and minimally processed items  - Typical grain sources include wheat, oats, barley, corn, brown rice, bulgar, farro, millet, polenta, couscous  - Various types of beans include chickpeas, lentils, fava beans, black beans, white beans   Fruits  Veggies - Large quantities of antioxidant rich fruits & veggies;  6 or more servings  - Vegetables can be eaten raw or lightly drizzled with oil and cooked  - Vegetables common to the traditional Mediterranean Diet include: artichokes, arugula, beets, broccoli, brussel sprouts, cabbage, carrots, celery, collard greens, cucumbers, eggplant, kale, leeks, lemons, lettuce, mushrooms, okra, onions, peas, peppers, potatoes, pumpkin, radishes, rutabaga, shallots, spinach, sweet potatoes, turnips, zucchini - Fruits common to the Mediterranean Diet include: apples, apricots, avocados, cherries, clementines, dates, figs, grapefruits, grapes, melons, nectarines, oranges, peaches, pears, pomegranates, strawberries, tangerines  Fats - Replace butter and margarine with healthy oils, such as olive oil, canola oil, and tahini  - Limit nuts to no more than a handful a day  - Nuts include walnuts, almonds, pecans, pistachios, pine nuts  - Limit or avoid candied, honey roasted or heavily salted nuts - Olives are central to the Mediterranean diet - can be eaten whole or used in a variety of dishes   Meats Protein - Limiting red meat: no more than a few times a month - When eating red meat: choose lean cuts and keep the portion to the size of deck of cards - Eggs: approx. 0 to 4 times a week  - Fish and lean poultry: at least 2 a week  - Healthy protein sources include, chicken, turkey, lean beef, lamb - Increase intake of seafood such as tuna, salmon, trout, mackerel, shrimp, scallops - Avoid or limit high fat processed meats such as sausage and bacon  Dairy - Include moderate amounts of low fat dairy products  - Focus on healthy dairy such   as fat free yogurt, skim milk, low or reduced fat cheese - Limit dairy products higher in fat such as whole or 2% milk, cheese, ice cream  Alcohol - Moderate amounts of red wine is ok  - No more than 5 oz daily for women (all ages) and men older than age 65  - No more than 10 oz of wine daily for men younger than 65  Other - Limit sweets and  other desserts  - Use herbs and spices instead of salt to flavor foods  - Herbs and spices common to the traditional Mediterranean Diet include: basil, bay leaves, chives, cloves, cumin, fennel, garlic, lavender, marjoram, mint, oregano, parsley, pepper, rosemary, sage, savory, sumac, tarragon, thyme   It's not just a diet, it's a lifestyle:  . The Mediterranean diet includes lifestyle factors typical of those in the region  . Foods, drinks and meals are best eaten with others and savored . Daily physical activity is important for overall good health . This could be strenuous exercise like running and aerobics . This could also be more leisurely activities such as walking, housework, yard-work, or taking the stairs . Moderation is the key; a balanced and healthy diet accommodates most foods and drinks . Consider portion sizes and frequency of consumption of certain foods   Meal Ideas & Options:  . Breakfast:  o Whole wheat toast or whole wheat English muffins with peanut butter & hard boiled egg o Steel cut oats topped with apples & cinnamon and skim milk  o Fresh fruit: banana, strawberries, melon, berries, peaches  o Smoothies: strawberries, bananas, greek yogurt, peanut butter o Low fat greek yogurt with blueberries and granola  o Egg white omelet with spinach and mushrooms o Breakfast couscous: whole wheat couscous, apricots, skim milk, cranberries  . Sandwiches:  o Hummus and grilled vegetables (peppers, zucchini, squash) on whole wheat bread   o Grilled chicken on whole wheat pita with lettuce, tomatoes, cucumbers or tzatziki  o Tuna salad on whole wheat bread: tuna salad made with greek yogurt, olives, red peppers, capers, green onions o Garlic rosemary lamb pita: lamb sauted with garlic, rosemary, salt & pepper; add lettuce, cucumber, greek yogurt to pita - flavor with lemon juice and black pepper  . Seafood:  o Mediterranean grilled salmon, seasoned with garlic, basil, parsley,  lemon juice and black pepper o Shrimp, lemon, and spinach whole-grain pasta salad made with low fat greek yogurt  o Seared scallops with lemon orzo  o Seared tuna steaks seasoned salt, pepper, coriander topped with tomato mixture of olives, tomatoes, olive oil, minced garlic, parsley, green onions and cappers  . Meats:  o Herbed greek chicken salad with kalamata olives, cucumber, feta  o Red bell peppers stuffed with spinach, bulgur, lean ground beef (or lentils) & topped with feta   o Kebabs: skewers of chicken, tomatoes, onions, zucchini, squash  o Turkey burgers: made with red onions, mint, dill, lemon juice, feta cheese topped with roasted red peppers . Vegetarian o Cucumber salad: cucumbers, artichoke hearts, celery, red onion, feta cheese, tossed in olive oil & lemon juice  o Hummus and whole grain pita points with a greek salad (lettuce, tomato, feta, olives, cucumbers, red onion) o Lentil soup with celery, carrots made with vegetable broth, garlic, salt and pepper  o Tabouli salad: parsley, bulgur, mint, scallions, cucumbers, tomato, radishes, lemon juice, olive oil, salt and pepper.      

## 2016-08-28 LAB — PSA: Prostate Specific Ag, Serum: 0.8 ng/mL (ref 0.0–4.0)

## 2016-08-28 LAB — COMPREHENSIVE METABOLIC PANEL
A/G RATIO: 1.7 (ref 1.2–2.2)
ALBUMIN: 4.4 g/dL (ref 3.5–5.5)
ALT: 17 IU/L (ref 0–44)
AST: 22 IU/L (ref 0–40)
Alkaline Phosphatase: 62 IU/L (ref 39–117)
BILIRUBIN TOTAL: 1.3 mg/dL — AB (ref 0.0–1.2)
BUN / CREAT RATIO: 18 (ref 9–20)
BUN: 16 mg/dL (ref 6–24)
CHLORIDE: 102 mmol/L (ref 96–106)
CO2: 24 mmol/L (ref 18–29)
Calcium: 9.3 mg/dL (ref 8.7–10.2)
Creatinine, Ser: 0.9 mg/dL (ref 0.76–1.27)
GFR calc non Af Amer: 93 mL/min/{1.73_m2} (ref >59)
GFR, EST AFRICAN AMERICAN: 108 mL/min/{1.73_m2} (ref >59)
GLUCOSE: 98 mg/dL (ref 65–99)
Globulin, Total: 2.6 g/dL (ref 1.5–4.5)
Potassium: 5.2 mmol/L (ref 3.5–5.2)
Sodium: 142 mmol/L (ref 134–144)
TOTAL PROTEIN: 7 g/dL (ref 6.0–8.5)

## 2016-08-28 LAB — HEMOGLOBIN A1C
Est. average glucose Bld gHb Est-mCnc: 105 mg/dL
HEMOGLOBIN A1C: 5.3 % (ref 4.8–5.6)

## 2016-08-28 LAB — LIPID PANEL
Chol/HDL Ratio: 4.1 ratio (ref 0.0–5.0)
Cholesterol, Total: 260 mg/dL — ABNORMAL HIGH (ref 100–199)
HDL: 64 mg/dL (ref >39)
LDL Calculated: 173 mg/dL — ABNORMAL HIGH (ref 0–99)
TRIGLYCERIDES: 113 mg/dL (ref 0–149)
VLDL Cholesterol Cal: 23 mg/dL (ref 5–40)

## 2016-09-03 ENCOUNTER — Telehealth: Payer: Self-pay | Admitting: Physician Assistant

## 2016-09-03 NOTE — Telephone Encounter (Signed)
Pt called saying that he needed Chelle to call the insurance company about a certain prescription that they will not approve without speaking with her first, Danielle Dess is the medication & he says that this is new because he's been taking this for a while now.   Please Advise [id number]-UJ number [id number]- Eligibility  [id number]- Prescriptions  [employer] Ins.

## 2016-09-16 NOTE — Telephone Encounter (Signed)
No prior auth needed.  Patient received paid claim on 09/05/16.

## 2017-02-28 ENCOUNTER — Encounter: Payer: Self-pay | Admitting: Family Medicine

## 2017-02-28 NOTE — Progress Notes (Signed)
Subjective:    Patient ID: Jay Davidson, male    DOB: 01/29/1956, 61 y.o.   MRN: [id number] Chief Complaint  Patient presents with  . Annual Exam    HPI [name] is a delightful 61 yo male here for a 6 mos f/u on chronic medical conditions/med refills and for his CPE (last done 2 yrs prior).  Primary Preventative Screenings: Prostate Cancer: 6 mo prior PSA 0.8 STI screening: declines need. Neg HIV and Hep C screen 2016. Colorectal Cancer: done 2014 - don't have any records of this - [facility] Tobacco use/AAA/Lung/EtOH/Illicit substances: h/o tobacco use Cardiac: baseline EKG 03/14/2015 - incomplete RBBB. seen by cardiologist Dr. Clifton James in 2010/2011, echo 11/2009 - grade 2 diastolic dysfunction, mild AR/MR Weight/Blood sugar/Diet/Exercise: a1c 5.3 6 mos prior BMI Readings from Last 3 Encounters:  08/27/16 27.56 kg/m  03/12/16 27.01 kg/m  12/25/15 27.51 kg/m  OTC/Vit/Supp/Herbal: Dentist/Optho: Immunizations:  Immunization History  Administered Date(s) Administered  . Influenza,inj,Quad PF,6+ Mos 03/14/2015, 03/12/2016  . Td 05/25/2012     Chronic Medical Conditions: Chronic pain syndrome: Due OA in knees, elbow, lumbar, h/o Rt total hip in 2006 w/ revision 05/2011 at [facility] 2/2 OA.  Uses tramadol 2 tabs bid and flexeril qhs prn -  stable on same dose for years. 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   HLD: 2 yrs prior ASCVD risk 6.5% ->___ (recalc with levels 6 mos ago) but despite work on tlc - rarely eating red meat or fried food, eats a lot of fish and takes fish oil occ - lipid panel has only worsened. He was a vegetarian for 15 yrs so really minimal meat. Over past 2 yrs, LDL 165 -> 156 ->173 with non-HDL decreasing from 178 -> 166 -> 196.  H/o reassuring detailed lipoprofile break down after which rec to start a red yeast rice supp which didn't really help. Lifelong h/o HLD in pt and whole family - all his brothers are  on cholesterol medication so he is willing to start whenever he needs to.  Past Medical History:  Diagnosis Date  . Asymptomatic cholelithiasis    chronic  . At risk for sleep apnea    STOP-BANG= 4  . History of diverticulitis of colon   . Incomplete RBBB   . Left ureteral calculus 11/2012, 01/2014   recurrent seen by urology Dr. Vernie Ammons 01/2014  . OA (osteoarthritis)    Past Surgical History:  Procedure Laterality Date  . CYSTOSCOPY WITH URETEROSCOPY AND STENT PLACEMENT Left 02/02/2014   Procedure: CYSTOSCOPY WITH LEFT RETRGRADE, URETEROSCOPY AND STENT PLACEMENT;  Surgeon: Garnett Farm, MD;  Location: [facility];  Service: Urology;  Laterality: Left;  . ELBOW SURGERY Right 2001  . HOLMIUM LASER APPLICATION Left 02/02/2014   Procedure: HOLMIUM LASER APPLICATION;  Surgeon: Garnett Farm, MD;  Location: [facility];  Service: Urology;  Laterality: Left;  . KNEE ARTHROSCOPY Bilateral left 1984/   right 1996  . LEFT ELBOW RECONSTRUCTION COMMON EXTENSOR, LATERAL EPICONDYLE  01-24-2002  . TOTAL HIP ARTHROPLASTY Right 10-07-2004  . TOTAL HIP REVISION Right 2012  . TRANSTHORACIC ECHOCARDIOGRAM  11-26-2009   grade II diastolic dysfunction/  ef 55-60%/  mild AR & MR/  mild aortic sclerosis without stenosis   Current Outpatient Prescriptions on File Prior to Visit  Medication Sig Dispense Refill  . aspirin 81 MG chewable tablet Chew 81 mg by mouth daily.    . Multiple  Vitamins-Minerals (MULTIVITAMIN WITH MINERALS) tablet Take 1 tablet by mouth daily.    . Omega-3 Fatty Acids (FISH OIL) 1000 MG CAPS Take by mouth.     No current facility-administered medications on file prior to visit.    Allergies  Allergen Reactions  . Pollen Extract Other (See Comments)    Unspecified reaction   Family History  Problem Relation Age of Onset  . Hypertension Mother   . Mental illness Father    Social History   Social History  . Marital status: Married    Spouse  name: N/A  . Number of children: N/A  . Years of education: N/A   Occupational History  . 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 Smoking status: Former Smoker    Packs/day: 1.00    Years: 20.00    Types: Cigarettes    Quit date: 01/31/1989  . Smokeless tobacco: Former Neurosurgeon    Types: Snuff, Chew    Quit date: 01/31/1989  . Alcohol use 7.2 oz/week    12 Standard drinks or equivalent per week  . Drug use: No  . Sexual activity: Yes   Other Topics Concern  . None   Social History Narrative   Divorced.   Depression screen [facility] 2/9 03/01/2017 08/27/2016 03/12/2016 12/25/2015 09/12/2015  Decreased Interest 0 0 0 0 0  Down, Depressed, Hopeless 0 0 0 0 0  PHQ - 2 Score 0 0 0 0 0      Review of Systems Had chronic RUQ pain from gallstones for sev wks - thinking about removal - ok to call for surg referral if resumes   see hpi Objective:   Physical Exam  Constitutional: He is oriented to person, place, and time. He appears well-developed and well-nourished. No distress.  HENT:  Head: Normocephalic and atraumatic.  Right Ear: Tympanic membrane, external ear and ear canal normal.  Left Ear: Tympanic membrane, external ear and ear canal normal.  Nose: Nose normal.  Mouth/Throat: Uvula is midline, oropharynx is clear and moist and mucous membranes are normal. No oropharyngeal exudate.  Eyes: Conjunctivae are normal. Right eye exhibits no discharge. Left eye exhibits no discharge. No scleral icterus.  Neck: Normal range of motion. Neck supple. No thyromegaly present.  Cardiovascular: Normal rate, regular rhythm, normal heart sounds and intact distal pulses.   Pulmonary/Chest: Effort normal and breath sounds normal. No respiratory distress.  Abdominal: Soft. Bowel sounds are normal. He exhibits no distension and no mass. There is no tenderness. There is no rebound and no guarding.  Genitourinary: Prostate normal. Prostate is not enlarged and not tender.  Musculoskeletal: He  exhibits no edema.  Lymphadenopathy:    He has no cervical adenopathy.  Neurological: He is alert and oriented to person, place, and time. He has normal reflexes. No cranial nerve deficit. He exhibits normal muscle tone.  Skin: Skin is warm and dry. No rash noted. He is not diaphoretic. No erythema.  Psychiatric: He has a normal mood and affect. His behavior is normal.    BP (!) 158/68 (BP Location: Left Arm, Patient Position: Sitting, Cuff Size: Large)   Pulse 81   Temp 98.8 F (37.1 C) (Oral)   Resp 16   Ht 5' 8.75" (1.746 m)   Wt 184 lb 8 oz (83.7 kg)   SpO2 96%   BMI 27.44 kg/m       Visual Acuity Screening   Right eye Left eye Both eyes  Without correction: 20/25 20/25 20/20  With correction:      Dg Os Calcis Right  Result Date: 03/01/2017 CLINICAL DATA:  Ankle pain EXAM: RIGHT OS CALCIS - 2+ VIEW COMPARISON:  None. FINDINGS: No acute fracture. No dislocation. Spurring at the posterior and inferior calcaneus is noted. Mild degenerative changes in the midfoot. IMPRESSION: No acute bony pathology.  Chronic change. Electronically Signed   By: Jolaine Click M.D.   On: 03/01/2017 10:05   Dg Toe Great Left  Result Date: 03/01/2017 CLINICAL DATA:  6 mos pain at achilles insertion site on Rt, dropped 45 lb weight onto left 1st toe yest. numb, decreased ROM at MTP EXAM: LEFT GREAT TOE COMPARISON:  04/12/2012 FINDINGS: No fracture.  No bone lesion. The joints are normally spaced and aligned. Minimal marginal osteophytes noted at first metatarsophalangeal joint. No other arthropathic change. Soft tissues are unremarkable. Lateral view shows a moderate-sized plantar calcaneal spur. IMPRESSION: No fracture or dislocation. Electronically Signed   By: Amie Portland M.D.   On: 03/01/2017 09:58    Assessment & Plan:   Ua, cmp, lipid, tsh, cbc Flu shot 1. Annual physical exam  Get record of last colonoscopy - [facility]  2. Screening for cardiovascular, respiratory, and genitourinary  diseases   3. Screening for deficiency anemia   4. Screening for thyroid disorder   5. Chronic pain syndrome - f/u in 6 mos for tramadol refill  6. Elevated cholesterol - start statin  7. Need for prophylactic vaccination and inoculation against influenza   8. Crush injury to foot, left, initial encounter   9. Heel pain, chronic, right   10. Sub-Achilles bursitis, right - try icing and heal pads - try topical diclofenac    Orders Placed This Encounter  Procedures  . DG Os Calcis Right    Standing Status:   Future    Number of Occurrences:   1    Standing Expiration Date:   03/01/2018    Order Specific Question:   Reason for Exam (SYMPTOM  OR DIAGNOSIS REQUIRED)    Answer:   6 mos pain at achilles insertion site on Rt, dropped 45 lb weight onto left 1st toe yest. numb, decreased ROM at MTP    Order Specific Question:   Preferred imaging location?    Answer:   External  . DG Toe Great Left    Standing Status:   Future    Number of Occurrences:   1    Standing Expiration Date:   03/01/2018    Order Specific Question:   Reason for Exam (SYMPTOM  OR DIAGNOSIS REQUIRED)    Answer:   6 mos pain at achilles insertion site on Rt, dropped 45 lb weight onto left 1st toe yest. numb, decreased ROM at MTP    Order Specific Question:   Preferred imaging location?    Answer:   External  . Flu Vaccine QUAD 36+ mos IM  . Comprehensive metabolic panel    Order Specific Question:   Has the patient fasted?    Answer:   Yes  . Lipid panel    Order Specific Question:   Has the patient fasted?    Answer:   Yes  . TSH  . CBC with Differential/Platelet  . Care order/instruction:    Scheduling Instructions:     Recheck BP  . POCT urinalysis dipstick    Meds ordered this encounter  Medications  . pravastatin (PRAVACHOL) 40 MG tablet    Sig: Take 1 tablet (40 mg total) by mouth  daily.    Dispense:  90 tablet    Refill:  1  . DISCONTD: traMADol (ULTRAM) 50 MG tablet    Sig: Take 1-2 tablets  (50-100 mg total) by mouth every 6 (six) hours as needed for moderate pain.    Dispense:  180 tablet    Refill:  0  . traMADol (ULTRAM) 50 MG tablet    Sig: Take 1-2 tablets (50-100 mg total) by mouth every 6 (six) hours as needed for moderate pain.    Dispense:  120 tablet    Refill:  4    Norberto Sorenson, M.D.  Primary Care at [facility] [address] [place], [place] [address] [phone] phone [phone] fax  03/03/17 12:24 AM

## 2017-03-01 ENCOUNTER — Ambulatory Visit (INDEPENDENT_AMBULATORY_CARE_PROVIDER_SITE_OTHER): Payer: 59

## 2017-03-01 ENCOUNTER — Ambulatory Visit (INDEPENDENT_AMBULATORY_CARE_PROVIDER_SITE_OTHER): Payer: 59 | Admitting: Family Medicine

## 2017-03-01 ENCOUNTER — Encounter: Payer: Self-pay | Admitting: Family Medicine

## 2017-03-01 VITALS — BP 158/68 | HR 81 | Temp 98.8°F | Resp 16 | Ht 68.75 in | Wt 184.5 lb

## 2017-03-01 DIAGNOSIS — M79671 Pain in right foot: Secondary | ICD-10-CM

## 2017-03-01 DIAGNOSIS — Z1389 Encounter for screening for other disorder: Secondary | ICD-10-CM

## 2017-03-01 DIAGNOSIS — S9782XA Crushing injury of left foot, initial encounter: Secondary | ICD-10-CM | POA: Diagnosis not present

## 2017-03-01 DIAGNOSIS — Z0000 Encounter for general adult medical examination without abnormal findings: Secondary | ICD-10-CM | POA: Diagnosis not present

## 2017-03-01 DIAGNOSIS — M7751 Other enthesopathy of right foot: Secondary | ICD-10-CM

## 2017-03-01 DIAGNOSIS — Z136 Encounter for screening for cardiovascular disorders: Secondary | ICD-10-CM

## 2017-03-01 DIAGNOSIS — E7800 Pure hypercholesterolemia, unspecified: Secondary | ICD-10-CM

## 2017-03-01 DIAGNOSIS — Z1383 Encounter for screening for respiratory disorder NEC: Secondary | ICD-10-CM

## 2017-03-01 DIAGNOSIS — Z130 Encounter for screening for diseases of the blood and blood-forming organs and certain disorders involving the immune mechanism: Secondary | ICD-10-CM

## 2017-03-01 DIAGNOSIS — Z23 Encounter for immunization: Secondary | ICD-10-CM | POA: Diagnosis not present

## 2017-03-01 DIAGNOSIS — G894 Chronic pain syndrome: Secondary | ICD-10-CM | POA: Diagnosis not present

## 2017-03-01 DIAGNOSIS — G8929 Other chronic pain: Secondary | ICD-10-CM

## 2017-03-01 DIAGNOSIS — Z1329 Encounter for screening for other suspected endocrine disorder: Secondary | ICD-10-CM | POA: Diagnosis not present

## 2017-03-01 DIAGNOSIS — M7660 Achilles tendinitis, unspecified leg: Secondary | ICD-10-CM

## 2017-03-01 LAB — POCT URINALYSIS DIP (MANUAL ENTRY)
Bilirubin, UA: NEGATIVE
GLUCOSE UA: NEGATIVE mg/dL
Ketones, POC UA: NEGATIVE mg/dL
LEUKOCYTES UA: NEGATIVE
NITRITE UA: NEGATIVE
Protein Ur, POC: NEGATIVE mg/dL
Spec Grav, UA: 1.02 (ref 1.010–1.025)
UROBILINOGEN UA: 0.2 U/dL
pH, UA: 8 (ref 5.0–8.0)

## 2017-03-01 MED ORDER — PRAVASTATIN SODIUM 40 MG PO TABS: 40.0000 mg | ORAL_TABLET | Freq: Every day | ORAL | 1 refills | Status: DC

## 2017-03-01 MED ORDER — TRAMADOL HCL 50 MG PO TABS: 50.0000 mg | ORAL_TABLET | Freq: Four times a day (QID) | ORAL | 4 refills | Status: DC | PRN

## 2017-03-01 MED ORDER — TRAMADOL HCL 50 MG PO TABS: 50.0000 mg | ORAL_TABLET | Freq: Four times a day (QID) | ORAL | 0 refills | Status: DC | PRN

## 2017-03-01 NOTE — Patient Instructions (Addendum)
PLEASE SIGN A RELEASE OF RECORDS FOR [facility] SO WE CAN GET A COPY OF YOUR COLONOSCOPY.   IF you received an x-ray today, you will receive an invoice from [facility]. Please contact [facility] at [phone] with questions or concerns regarding your invoice.   IF you received labwork today, you will receive an invoice from [facility]. Please contact [facility] at [phone] with questions or concerns regarding your invoice.   Our billing staff will not be able to assist you with questions regarding bills from these companies.  You will be contacted with the lab results as soon as they are available. The fastest way to get your results is to activate your My Chart account. Instructions are located on the last page of this paperwork. If you have not heard from [place] regarding the results in 2 weeks, please contact this office.     Achilles Tendinitis Achilles tendinitis is inflammation of the tough, cord-like band that attaches the lower leg muscles to the heel bone (Achilles tendon). This is usually caused by overusing the tendon and the ankle joint. Achilles tendinitis usually gets better over time with treatment and caring for yourself at home. It can take weeks or months to heal completely. What are the causes? This condition may be caused by:  A sudden increase in exercise or activity, such as running.  Doing the same exercises or activities (such as jumping) over and over.  Not warming up calf muscles before exercising.  Exercising in shoes that are worn out or not made for exercise.  Having arthritis or a bone growth (spur) on the back of the heel bone. This can rub against the tendon and hurt it.  Age-related wear and tear. Tendons become less flexible with age and more likely to be injured.  What are the signs or symptoms? Common symptoms of this condition include:  Pain in the Achilles tendon or in the back of the leg, just above the heel. The pain usually  gets worse with exercise.  Stiffness or soreness in the back of the leg, especially in the morning.  Swelling of the skin over the Achilles tendon.  Thickening of the tendon.  Bone spurs at the bottom of the Achilles tendon, near the heel.  Trouble standing on tiptoe.  How is this diagnosed? This condition is diagnosed based on your symptoms and a physical exam. You may have tests, including:  X-rays.  MRI.  How is this treated? The goal of treatment is to relieve symptoms and help your injury heal. Treatment may include:  Decreasing or stopping activities that caused the tendinitis. This may mean switching to low-impact exercises like biking or swimming.  Icing the injured area.  Doing physical therapy, including strengthening and stretching exercises.  NSAIDs to help relieve pain and swelling.  Using supportive shoes, wraps, heel lifts, or a walking boot (air cast).  Surgery. This may be done if your symptoms do not improve after 6 months.  Using high-energy shock wave impulses to stimulate the healing process (extracorporeal shock wave therapy). This is rare.  Injection of medicines to help relieve inflammation (corticosteroids). This is rare.  Follow these instructions at home: If you have an air cast:  Wear the cast as told by your health care provider. Remove it only as told by your health care provider.  Loosen the cast if your toes tingle, become numb, or turn cold and blue. Activity  Gradually return to your normal activities once your health care provider approves.  Do not do activities that cause pain. ? Consider doing low-impact exercises, like cycling or swimming.  If you have an air cast, ask your health care provider when it is safe for you to drive.  If physical therapy was prescribed, do exercises as told by your health care provider or physical therapist. Managing pain, stiffness, and swelling  Raise (elevate) your foot above the level of your  heart while you are sitting or lying down.  Move your toes often to avoid stiffness and to lessen swelling.  If directed, put ice on the injured area: ? Put ice in a plastic bag. ? Place a towel between your skin and the bag. ? Leave the ice on for 20 minutes, 2-3 times a day General instructions  If directed, wrap your foot with an elastic bandage or other wrap. This can help keep your tendon from moving too much while it heals. Your health care provider will show you how to wrap your foot correctly.  Wear supportive shoes or heel lifts only as told by your health care provider.  Take over-the-counter and prescription medicines only as told by your health care provider.  Keep all follow-up visits as told by your health care provider. This is important. Contact a health care provider if:  You have symptoms that gets worse.  You have pain that does not get better with medicine.  You develop new, unexplained symptoms.  You develop warmth and swelling in your foot.  You have a fever. Get help right away if:  You have a sudden popping sound or sensation in your Achilles tendon followed by severe pain.  You cannot move your toes or foot.  You cannot put any weight on your foot. Summary  Achilles tendinitis is inflammation of the tough, cord-like band that attaches the lower leg muscles to the heel bone (Achilles tendon).  This condition is usually caused by overusing the tendon and the ankle joint. It can also be caused by arthritis or normal aging.  The most common symptoms of this condition include pain, swelling, or stiffness in the Achilles tendon or in the back of the leg.  This condition is usually treated with rest, NSAIDs, and physical therapy. This information is not intended to replace advice given to you by your health care provider. Make sure you discuss any questions you have with your health care provider. Document Released: 02/18/2005 Document Revised:  03/30/2016 Document Reviewed: 03/30/2016 Elsevier Interactive Patient Education  2017 [employer]  Achilles Tendinitis Rehab Ask your health care provider which exercises are safe for you. Do exercises exactly as told by your health care provider and adjust them as directed. It is normal to feel mild stretching, pulling, tightness, or discomfort as you do these exercises, but you should stop right away if you feel sudden pain or your pain gets worse. Do not begin these exercises until told by your health care provider. Stretching and range of motion exercises These exercises warm up your muscles and joints and improve the movement and flexibility of your ankle. These exercises also help to relieve pain, numbness, and tingling. Exercise A: Standing wall calf stretch, knee straight  1. Stand with your hands against a wall. 2. Extend your __________ leg behind you and bend your front knee slightly. Keep both of your heels on the floor. 3. Point the toes of your back foot slightly inward. 4. Keeping your heels on the floor and your back knee straight, shift your weight toward the wall. Do  not allow your back to arch. You should feel a gentle stretch in your calf. 5. Hold this position for seconds. Repeat __________ times. Complete this stretch __________ times per day. Exercise B: Standing wall calf stretch, knee bent 1. Stand with your hands against a wall. 2. Extend your __________ leg behind you, and bend your front knee slightly. Keep both of your heels on the floor. 3. Point the toes of your back foot slightly inward. 4. Keeping your heels on the floor, unlock your back knee so that it is bent. You should feel a gentle stretch deep in your calf. 5. Hold this position for __________ seconds. Repeat __________ times. Complete this stretch __________ times per day. Strengthening exercises These exercises build strength and control of your ankle. Endurance is the ability to use your muscles  for a long time, even after they get tired. Exercise C: Plantar flexion with band  1. Sit on the floor with your __________ leg extended. You may put a pillow under your calf to give your foot more room to move. 2. Loop a rubber exercise band or tube around the ball of your __________ foot. The ball of your foot is on the walking surface, right under your toes. The band or tube should be slightly tense when your foot is relaxed. If the band or tube slips, you can put on your shoe or put a washcloth between the band and your foot to help it stay in place. 3. Slowly point your toes downward, pushing them away from you. 4. Hold this position for __________ seconds. 5. Slowly release the tension in the band or tube, controlling smoothly until your foot is back to the starting position. Repeat __________ times. Complete this exercise __________ times per day. Exercise D: Heel raise with eccentric lower  1. Stand on a step with the balls of your feet. The ball of your foot is on the walking surface, right under your toes. ? Do not put your heels on the step. ? For balance, rest your hands on the wall or on a railing. 2. Rise up onto the balls of your feet. 3. Keeping your heels up, shift all of your weight to your __________ leg and pick up your other leg. 4. Slowly lower your __________ leg so your heel drops below the level of the step. 5. Put down your foot. If told by your health care provider, build up to:  3 sets of 15 repetitions while keeping your knees straight.  3 sets of 15 repetitions while keeping your knees bent as far as told by your health care provider.  Complete this exercise __________ times per day. If this exercise is too easy, try doing it while wearing a backpack with weights in it. Balance exercises These exercises improve or maintain your balance. Balance is important in preventing falls. Exercise E: Single leg stand 1. Without shoes, stand near a railing or in a door  frame. Hold on to the railing or door frame as needed. 2. Stand on your __________ foot. Keep your big toe down on the floor and try to keep your arch lifted. 3. Hold this position for __________ seconds. Repeat __________ times. Complete this exercise __________ times per day. If this exercise is too easy, you can try it with your eyes closed or while standing on a pillow. This information is not intended to replace advice given to you by your health care provider. Make sure you discuss any questions you have with your  health care provider. Document Released: 12/10/2004 Document Revised: 01/16/2016 Document Reviewed: 01/15/2015 Elsevier Interactive Patient Education  [employer].

## 2017-03-02 LAB — LIPID PANEL
CHOLESTEROL TOTAL: 237 mg/dL — AB (ref 100–199)
Chol/HDL Ratio: 3.6 ratio (ref 0.0–5.0)
HDL: 66 mg/dL (ref >39)
LDL CALC: 158 mg/dL — AB (ref 0–99)
TRIGLYCERIDES: 64 mg/dL (ref 0–149)
VLDL Cholesterol Cal: 13 mg/dL (ref 5–40)

## 2017-03-02 LAB — CBC WITH DIFFERENTIAL/PLATELET
BASOS: 1 %
Basophils Absolute: 0 10*3/uL (ref 0.0–0.2)
EOS (ABSOLUTE): 0.1 10*3/uL (ref 0.0–0.4)
EOS: 2 %
HEMATOCRIT: 42.6 % (ref 37.5–51.0)
HEMOGLOBIN: 14.5 g/dL (ref 13.0–17.7)
IMMATURE GRANULOCYTES: 0 %
Immature Grans (Abs): 0 10*3/uL (ref 0.0–0.1)
Lymphocytes Absolute: 1.5 10*3/uL (ref 0.7–3.1)
Lymphs: 40 %
MCH: 31 pg (ref 26.6–33.0)
MCHC: 34 g/dL (ref 31.5–35.7)
MCV: 91 fL (ref 79–97)
MONOCYTES: 9 %
Monocytes Absolute: 0.3 10*3/uL (ref 0.1–0.9)
NEUTROS PCT: 48 %
Neutrophils Absolute: 1.7 10*3/uL (ref 1.4–7.0)
Platelets: 252 10*3/uL (ref 150–379)
RBC: 4.68 x10E6/uL (ref 4.14–5.80)
RDW: 13.3 % (ref 12.3–15.4)
WBC: 3.6 10*3/uL (ref 3.4–10.8)

## 2017-03-02 LAB — COMPREHENSIVE METABOLIC PANEL
ALK PHOS: 62 IU/L (ref 39–117)
ALT: 18 IU/L (ref 0–44)
AST: 26 IU/L (ref 0–40)
Albumin/Globulin Ratio: 1.7 (ref 1.2–2.2)
Albumin: 4.5 g/dL (ref 3.5–5.5)
BUN/Creatinine Ratio: 12 (ref 9–20)
BUN: 11 mg/dL (ref 6–24)
Bilirubin Total: 1.1 mg/dL (ref 0.0–1.2)
CALCIUM: 9.4 mg/dL (ref 8.7–10.2)
CO2: 22 mmol/L (ref 20–29)
CREATININE: 0.95 mg/dL (ref 0.76–1.27)
Chloride: 104 mmol/L (ref 96–106)
GFR calc Af Amer: 101 mL/min/{1.73_m2} (ref >59)
GFR calc non Af Amer: 87 mL/min/{1.73_m2} (ref >59)
GLOBULIN, TOTAL: 2.6 g/dL (ref 1.5–4.5)
GLUCOSE: 94 mg/dL (ref 65–99)
Potassium: 4.9 mmol/L (ref 3.5–5.2)
SODIUM: 141 mmol/L (ref 134–144)
Total Protein: 7.1 g/dL (ref 6.0–8.5)

## 2017-03-02 LAB — TSH: TSH: 1.98 u[IU]/mL (ref 0.450–4.500)

## 2017-03-03 ENCOUNTER — Encounter: Payer: Self-pay | Admitting: Family Medicine

## 2017-03-03 MED ORDER — DICLOFENAC SODIUM 1.5 % TD SOLN: 20.0000 [drp] | Freq: Two times a day (BID) | TRANSDERMAL | 1 refills | Status: DC

## 2017-03-04 ENCOUNTER — Encounter: Payer: Self-pay | Admitting: Family Medicine

## 2017-03-08 ENCOUNTER — Ambulatory Visit (INDEPENDENT_AMBULATORY_CARE_PROVIDER_SITE_OTHER): Payer: Managed Care, Other (non HMO) | Admitting: Family Medicine

## 2017-03-08 ENCOUNTER — Encounter: Payer: Self-pay | Admitting: Family Medicine

## 2017-03-08 VITALS — BP 180/80 | HR 76 | Temp 98.3°F | Resp 16 | Ht 68.75 in | Wt 186.6 lb

## 2017-03-08 DIAGNOSIS — M79671 Pain in right foot: Secondary | ICD-10-CM | POA: Diagnosis not present

## 2017-03-08 NOTE — Patient Instructions (Addendum)
Check into prescription for topical antiinflammatory.  Continue to use heel insert, ice massage as needed, and rest as able this week.  Follow up with Dr. Clelia Croft next week to determine if advanced imaging with MRI or CT scan will be needed. I will write you for some work restrictions or out of work this week until that follow-up.  Return to the clinic or go to the nearest emergency room if any of your symptoms worsen or new symptoms occur.    IF you received an x-ray today, you will receive an invoice from [facility]. Please contact [facility] at [phone] with questions or concerns regarding your invoice.   IF you received labwork today, you will receive an invoice from [facility]. Please contact [facility] at [phone] with questions or concerns regarding your invoice.   Our billing staff will not be able to assist you with questions regarding bills from these companies.  You will be contacted with the lab results as soon as they are available. The fastest way to get your results is to activate your My Chart account. Instructions are located on the last page of this paperwork. If you have not heard from [place] regarding the results in 2 weeks, please contact this office.

## 2017-03-08 NOTE — Progress Notes (Signed)
Subjective:  This chart was scribed for Shade Flood, MD by Veverly Fells, at Primary Care at [facility].  This patient was seen in room 10 and the patient's care was started at 11:43 AM.   Chief Complaint  Patient presents with  . heel pain    right heel; states its getting worse  . Follow-up    would like to have time off work for heel to heal     Patient ID: Jay Davidson, male    DOB: [birth year], 61 y.o.   MRN: [id number]  HPI HPI Comments: Jay Davidson is a 61 y.o. male who presents to Primary Care at [facility] for follow up of right heel pain.  PCP is Dr. Clelia Croft, seen by her one week ago. He has a history of chronic pain syndrome and takes two Tramadol twice per day, Flexeril- at bed time as needed. Also had noted of chronic heel pain, thought to have some component of achilles bursitis, recommended icing, heel pad, topical diclofenac.----Patients heel pain has been worsening over the past couple of months but he has been having pain for the past year.  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 He has been using the heel pad with his work shoes along with icing and stretching.  He has not yet picked up the topical diclofenac but will go pick it up at his pharmacy.   X-ray of his right calcaneus on October 8th without acute bony pathology.  There was spurring at the posterior and inferior calcaneous.       Patient Active Problem List   Diagnosis Date Noted  . Hip pain 09/11/2014  . Insomnia 09/11/2014  . Elevated cholesterol 09/11/2014  . Chronic pain syndrome 09/11/2014  . 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 Osteoarthritis of hip 04/21/2011  . 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 RIGHT BUNDLE BRANCH BLOCK 11/01/2009   Past Medical History:  Diagnosis Date  . Asymptomatic cholelithiasis    chronic  . At risk for sleep apnea    STOP-BANG= 4  . History of diverticulitis  of colon   . Incomplete RBBB   . Left ureteral calculus 11/2012, 01/2014   recurrent seen by urology Dr. Vernie Ammons 01/2014  . OA (osteoarthritis)    Past Surgical History:  Procedure Laterality Date  . CYSTOSCOPY WITH URETEROSCOPY AND STENT PLACEMENT Left 02/02/2014   Procedure: CYSTOSCOPY WITH LEFT RETRGRADE, URETEROSCOPY AND STENT PLACEMENT;  Surgeon: Garnett Farm, MD;  Location: [facility];  Service: Urology;  Laterality: Left;  . ELBOW SURGERY Right 2001  . HOLMIUM LASER APPLICATION Left 02/02/2014   Procedure: HOLMIUM LASER APPLICATION;  Surgeon: Garnett Farm, MD;  Location: [facility];  Service: Urology;  Laterality: Left;  . KNEE ARTHROSCOPY Bilateral left 1984/   right 1996  . LEFT ELBOW RECONSTRUCTION COMMON EXTENSOR, LATERAL EPICONDYLE  01-24-2002  . TOTAL HIP ARTHROPLASTY Right 10-07-2004  . TOTAL HIP REVISION Right 2012  . TRANSTHORACIC ECHOCARDIOGRAM  11-26-2009   grade II diastolic dysfunction/  ef 55-60%/  mild AR & MR/  mild aortic sclerosis without stenosis   Allergies  Allergen Reactions  . Pollen Extract Other (See Comments)    Unspecified reaction   Prior to Admission medications   Medication Sig Start Date End Date Taking? Authorizing Provider  aspirin 81 MG chewable tablet Chew 81 mg by mouth  daily.   Yes [provider]  Diclofenac Sodium 1.5 % SOLN Place 1 mL onto the skin 2 (two) times daily. 03/03/17  Yes Sherren Mocha, MD  Multiple Vitamins-Minerals (MULTIVITAMIN WITH MINERALS) tablet Take 1 tablet by mouth daily.   Yes [provider]  Omega-3 Fatty Acids (FISH OIL) 1000 MG CAPS Take by mouth.   Yes [provider]  pravastatin (PRAVACHOL) 40 MG tablet Take 1 tablet (40 mg total) by mouth daily. 03/01/17  Yes Sherren Mocha, MD  traMADol (ULTRAM) 50 MG tablet Take 1-2 tablets (50-100 mg total) by mouth every 6 (six) hours as needed for moderate pain. 04/01/17  Yes Sherren Mocha, MD   Social History    Social History  . Marital status: Married    Spouse name: N/A  . Number of children: N/A  . Years of education: N/A   Occupational History  . 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 Smoking status: Former Smoker    Packs/day: 1.00    Years: 20.00    Types: Cigarettes    Quit date: 01/31/1989  . Smokeless tobacco: Former Neurosurgeon    Types: Snuff, Chew    Quit date: 01/31/1989  . Alcohol use 7.2 oz/week    12 Standard drinks or equivalent per week  . Drug use: No  . Sexual activity: Yes   Other Topics Concern  . Not on file   Social History Narrative   Divorced.    Review of Systems  Constitutional: Negative for chills and fever.  Eyes: Negative for pain, redness and itching.  Respiratory: Negative for cough, choking and shortness of breath.   Gastrointestinal: Negative for nausea and vomiting.  Musculoskeletal: Negative for neck pain and neck stiffness.       Right heel pain  Neurological: Negative for syncope and speech difficulty.       Objective:   Physical Exam  Constitutional: He is oriented to person, place, and time. He appears well-developed and well-nourished. No distress.  HENT:  Head: Normocephalic and atraumatic.  Cardiovascular:  Pulses:      Dorsalis pedis pulses are 2+ on the right side, and 2+ on the left side.   NVI distally  Pulmonary/Chest: Effort normal. No respiratory distress.  Musculoskeletal:  Right : diffuse tenderness along the posterior calcaneus.  Achilles tendon is non tender. Retro calcaneal bursa non tender. Positive lateral squeeze but primarily posterior calcaneal pain. Plantar fascia is non tender, pain at posterior calcaneus with dorsiflexion, negative thomson.  Navicula 5th MT non tender.   Neurological: He is alert and oriented to person, place, and time.  Skin: Skin is warm and dry.  Psychiatric: He has a normal mood and affect. His behavior is normal.   Vitals:   03/08/17 1032 03/08/17 1035  BP: (!) 166/80 (!)  171/88  Pulse: 76   Resp: 16   Temp: 98.3 F (36.8 C)   TempSrc: Oral   SpO2: 98%   Weight: 186 lb 9.6 oz (84.6 kg)   Height: 5' 8.75" (1.746 m)         Assessment & Plan:  Jay Davidson is a 61 y.o. male Pain of right heel  -Chronic heel pain, appears to have had some symptoms of possible Achilles tendinitis or retrocalcaneal bursitis in the past. However based on exam at present, retrocalcaneal bursa and distal Achilles appear nontender. His area of primary discomfort is at the calcaneus itself. He does also have a positive  lateral squeeze, locating to calcaneus. Plantar fascia is nontender. Previous x-ray indicated posterior/inferior calcaneal spurring, no apparent fracture.  - will provide note for work for rest this week, or out of work if needed.   -recommend he check with pharmacy re: topical antiinflammatory.  - Continue ice massage, heel insert, then follow-up in one week CP. At that time can decide if advanced imaging with MRI versus CT will be needed to further evaluate calcaneus and possible stress injury.  No orders of the defined types were placed in this encounter.  Patient Instructions   Check into prescription for topical antiinflammatory.  Continue to use heel insert, ice massage as needed, and rest as able this week.  Follow up with Dr. Clelia Croft next week to determine if advanced imaging with MRI or CT scan will be needed. I will write you for some work restrictions or out of work this week until that follow-up.  Return to the clinic or go to the nearest emergency room if any of your symptoms worsen or new symptoms occur.    IF you received an x-ray today, you will receive an invoice from [facility]. Please contact [facility] at [phone] with questions or concerns regarding your invoice.   IF you received labwork today, you will receive an invoice from [place]. Please contact [facility] at [phone] with questions or concerns regarding your  invoice.   Our billing staff will not be able to assist you with questions regarding bills from these companies.  You will be contacted with the lab results as soon as they are available. The fastest way to get your results is to activate your My Chart account. Instructions are located on the last page of this paperwork. If you have not heard from [place] regarding the results in 2 weeks, please contact this office.       I personally performed the services described in this documentation, which was scribed in my presence. The recorded information has been reviewed and considered for accuracy and completeness, addended by me as needed, and agree with information above.  Signed,   [name], MD Primary Care at [facility].  03/10/17 9:45 PM

## 2017-08-24 ENCOUNTER — Other Ambulatory Visit: Payer: Self-pay | Admitting: Family Medicine

## 2017-08-30 ENCOUNTER — Encounter: Payer: Self-pay | Admitting: Family Medicine

## 2017-08-30 ENCOUNTER — Other Ambulatory Visit: Payer: Self-pay

## 2017-08-30 ENCOUNTER — Ambulatory Visit (INDEPENDENT_AMBULATORY_CARE_PROVIDER_SITE_OTHER): Payer: 59 | Admitting: Family Medicine

## 2017-08-30 VITALS — BP 151/79 | HR 79 | Temp 98.4°F | Resp 16 | Ht 68.75 in | Wt 185.0 lb

## 2017-08-30 DIAGNOSIS — Z5181 Encounter for therapeutic drug level monitoring: Secondary | ICD-10-CM | POA: Diagnosis not present

## 2017-08-30 DIAGNOSIS — G894 Chronic pain syndrome: Secondary | ICD-10-CM | POA: Diagnosis not present

## 2017-08-30 DIAGNOSIS — E7800 Pure hypercholesterolemia, unspecified: Secondary | ICD-10-CM | POA: Diagnosis not present

## 2017-08-30 DIAGNOSIS — I10 Essential (primary) hypertension: Secondary | ICD-10-CM

## 2017-08-30 LAB — COMPREHENSIVE METABOLIC PANEL
ALK PHOS: 55 IU/L (ref 39–117)
ALT: 19 IU/L (ref 0–44)
AST: 25 IU/L (ref 0–40)
Albumin/Globulin Ratio: 2.2 (ref 1.2–2.2)
Albumin: 4.6 g/dL (ref 3.6–4.8)
BILIRUBIN TOTAL: 0.8 mg/dL (ref 0.0–1.2)
BUN/Creatinine Ratio: 15 (ref 10–24)
BUN: 14 mg/dL (ref 8–27)
CHLORIDE: 106 mmol/L (ref 96–106)
CO2: 22 mmol/L (ref 20–29)
CREATININE: 0.93 mg/dL (ref 0.76–1.27)
Calcium: 9.4 mg/dL (ref 8.6–10.2)
GFR calc Af Amer: 103 mL/min/{1.73_m2} (ref >59)
GFR calc non Af Amer: 89 mL/min/{1.73_m2} (ref >59)
GLUCOSE: 107 mg/dL — AB (ref 65–99)
Globulin, Total: 2.1 g/dL (ref 1.5–4.5)
Potassium: 4.9 mmol/L (ref 3.5–5.2)
Sodium: 143 mmol/L (ref 134–144)
Total Protein: 6.7 g/dL (ref 6.0–8.5)

## 2017-08-30 LAB — LIPID PANEL
Chol/HDL Ratio: 2.8 ratio (ref 0.0–5.0)
Cholesterol, Total: 179 mg/dL (ref 100–199)
HDL: 64 mg/dL (ref >39)
LDL CALC: 104 mg/dL — AB (ref 0–99)
TRIGLYCERIDES: 57 mg/dL (ref 0–149)
VLDL CHOLESTEROL CAL: 11 mg/dL (ref 5–40)

## 2017-08-30 MED ORDER — TRAMADOL HCL 50 MG PO TABS: 50.0000 mg | ORAL_TABLET | Freq: Four times a day (QID) | ORAL | 5 refills | Status: DC | PRN

## 2017-08-30 MED ORDER — LISINOPRIL 20 MG PO TABS: 20.0000 mg | ORAL_TABLET | Freq: Every day | ORAL | 0 refills | Status: DC

## 2017-08-30 NOTE — Patient Instructions (Addendum)
Stop the pravastatin. Let me know in 1-2 mos if you are feeling back to normal, then we will see how much improvement you have had in your symptoms/side effects going back to baseline before retrying you on atorvastatin 20.  Start lisinopril 20. Drop by the office for a "lab-only" visit in 1 month.  You may walk-in to the [address] appointment clinic any time (M-F 8-6, Sat 8-4) to have your blood drawn without an appointment as I have already ordered the "future" labs in your chart. Make sure to let the front desk know that you are there for a lab-only visit and that you do not need to see a provider.  You do NOT need to be fasting.  This will be to recheck your kidneys and salts in your blood to ensure that the new BP medication is not causing any problems.  Let me know how your BP is running - if there is any improvement or do we need to adjust the medication dose/type.     IF you received an x-ray today, you will receive an invoice from [facility]. Please contact [facility] at [phone] with questions or concerns regarding your invoice.   IF you received labwork today, you will receive an invoice from [facility]. Please contact [facility] at [phone] with questions or concerns regarding your invoice.   Our billing staff will not be able to assist you with questions regarding bills from these companies.  You will be contacted with the lab results as soon as they are available. The fastest way to get your results is to activate your My Chart account. Instructions are located on the last page of this paperwork. If you have not heard from [place] regarding the results in 2 weeks, please contact this office.     Managing Your Hypertension Hypertension is commonly called high blood pressure. This is when the force of your blood pressing against the walls of your arteries is too strong. Arteries are blood vessels that carry blood from your heart throughout your body.  Hypertension forces the heart to work harder to pump blood, and may cause the arteries to become narrow or stiff. Having untreated or uncontrolled hypertension can cause heart attack, stroke, kidney disease, and other problems. What are blood pressure readings? A blood pressure reading consists of a higher number over a lower number. Ideally, your blood pressure should be below 120/80. The first ("top") number is called the systolic pressure. It is a measure of the pressure in your arteries as your heart beats. The second ("bottom") number is called the diastolic pressure. It is a measure of the pressure in your arteries as the heart relaxes. What does my blood pressure reading mean? Blood pressure is classified into four stages. Based on your blood pressure reading, your health care provider may use the following stages to determine what type of treatment you need, if any. Systolic pressure and diastolic pressure are measured in a unit called mm Hg. Normal  Systolic pressure: below 120.  Diastolic pressure: below 80. Elevated  Systolic pressure: 120-129.  Diastolic pressure: below 80. Hypertension stage 1  Systolic pressure: 130-139.  Diastolic pressure: 80-89. Hypertension stage 2  Systolic pressure: 140 or above.  Diastolic pressure: 90 or above. What health risks are associated with hypertension? Managing your hypertension is an important responsibility. Uncontrolled hypertension can lead to:  A heart attack.  A stroke.  A weakened blood vessel (aneurysm).  Heart failure.  Kidney damage.  Eye damage.  Metabolic syndrome.  Memory and concentration problems.  What changes can I make to manage my hypertension? Hypertension can be managed by making lifestyle changes and possibly by taking medicines. Your health care provider will help you make a plan to bring your blood pressure within a normal range. Eating and drinking  Eat a diet that is high in fiber and potassium,  and low in salt (sodium), added sugar, and fat. An example eating plan is called the DASH (Dietary Approaches to Stop Hypertension) diet. To eat this way: ? Eat plenty of fresh fruits and vegetables. Try to fill half of your plate at each meal with fruits and vegetables. ? Eat whole grains, such as whole wheat pasta, brown rice, or whole grain bread. Fill about one quarter of your plate with whole grains. ? Eat low-fat diary products. ? Avoid fatty cuts of meat, processed or cured meats, and poultry with skin. Fill about one quarter of your plate with lean proteins such as fish, chicken without skin, beans, eggs, and tofu. ? Avoid premade and processed foods. These tend to be higher in sodium, added sugar, and fat.  Reduce your daily sodium intake. Most people with hypertension should eat less than 1,500 mg of sodium a day.  Limit alcohol intake to no more than 1 drink a day for nonpregnant women and 2 drinks a day for men. One drink equals 12 oz of beer, 5 oz of wine, or 1 oz of hard liquor. Lifestyle  Work with your health care provider to maintain a healthy body weight, or to lose weight. Ask what an ideal weight is for you.  Get at least 30 minutes of exercise that causes your heart to beat faster (aerobic exercise) most days of the week. Activities may include walking, swimming, or biking.  Include exercise to strengthen your muscles (resistance exercise), such as weight lifting, as part of your weekly exercise routine. Try to do these types of exercises for 30 minutes at least 3 days a week.  Do not use any products that contain nicotine or tobacco, such as cigarettes and e-cigarettes. If you need help quitting, ask your health care provider.  Control any long-term (chronic) conditions you have, such as high cholesterol or diabetes. Monitoring  Monitor your blood pressure at home as told by your health care provider. Your personal target blood pressure may vary depending on your medical  conditions, your age, and other factors.  Have your blood pressure checked regularly, as often as told by your health care provider. Working with your health care provider  Review all the medicines you take with your health care provider because there may be side effects or interactions.  Talk with your health care provider about your diet, exercise habits, and other lifestyle factors that may be contributing to hypertension.  Visit your health care provider regularly. Your health care provider can help you create and adjust your plan for managing hypertension. Will I need medicine to control my blood pressure? Your health care provider may prescribe medicine if lifestyle changes are not enough to get your blood pressure under control, and if:  Your systolic blood pressure is 130 or higher.  Your diastolic blood pressure is 80 or higher.  Take medicines only as told by your health care provider. Follow the directions carefully. Blood pressure medicines must be taken as prescribed. The medicine does not work as well when you skip doses. Skipping doses also puts you at risk for problems. Contact a health care provider  if:  You think you are having a reaction to medicines you have taken.  You have repeated (recurrent) headaches.  You feel dizzy.  You have swelling in your ankles.  You have trouble with your vision. Get help right away if:  You develop a severe headache or confusion.  You have unusual weakness or numbness, or you feel faint.  You have severe pain in your chest or abdomen.  You vomit repeatedly.  You have trouble breathing. Summary  Hypertension is when the force of blood pumping through your arteries is too strong. If this condition is not controlled, it may put you at risk for serious complications.  Your personal target blood pressure may vary depending on your medical conditions, your age, and other factors. For most people, a normal blood pressure is less  than 120/80.  Hypertension is managed by lifestyle changes, medicines, or both. Lifestyle changes include weight loss, eating a healthy, low-sodium diet, exercising more, and limiting alcohol. This information is not intended to replace advice given to you by your health care provider. Make sure you discuss any questions you have with your health care provider. Document Released: 02/03/2012 Document Revised: 04/08/2016 Document Reviewed: 04/08/2016 Elsevier Interactive Patient Education  [employer].

## 2017-08-30 NOTE — Progress Notes (Addendum)
Subjective:  By signing my name below, I, [name], attest that this documentation has been prepared under the direction and in the presence of Norberto Sorenson, MD. Electronically Signed: [name], Scribe. 08/30/2017 , 8:47 AM .  Patient was seen in Room 2 .   Patient ID: Jay Davidson, male    DOB: [birth year], 62 y.o.   MRN: [id number] Chief Complaint  Patient presents with  . Medication Refill    6 month f/u - needs tramadol   HPI Jay Davidson is a 62 y.o. male who presents to Primary Care at [facility] for follow up. He is fasting today.   HTN He checks his BP at home occasionally. He states his BP is up and down, sometimes 120s, sometimes 140s. He denies lightheadedness, dizziness, chest pain or shortness of breath. His younger brother just started HTN medications.   Hyperlipidemia He has life long history of hyperlipidemia as well as his whole family. 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 He did have a detailed lipoprofile break down previously without indication for prescription statin following. At last visit, did start him on pravastatin 40mg .   He did start pravastatin but notes being constipated up to 2-4 days. He's still walking about 10-11 miles a day, and still eating minimal meat diet.   Chronic pain He notes tramadol working well for him.   Past Medical History:  Diagnosis Date  . Asymptomatic cholelithiasis    chronic  . At risk for sleep apnea    STOP-BANG= 4  . History of diverticulitis of colon   . Incomplete RBBB   . Left ureteral calculus 11/2012, 01/2014   recurrent seen by urology Dr. Vernie Ammons 01/2014  . OA (osteoarthritis)    Past Surgical History:  Procedure Laterality Date  . CYSTOSCOPY WITH URETEROSCOPY AND STENT PLACEMENT Left 02/02/2014   Procedure: CYSTOSCOPY WITH LEFT RETRGRADE, URETEROSCOPY AND STENT PLACEMENT;  Surgeon: Garnett Farm, MD;  Location: [facility];   Service: Urology;  Laterality: Left;  . ELBOW SURGERY Right 2001  . HOLMIUM LASER APPLICATION Left 02/02/2014   Procedure: HOLMIUM LASER APPLICATION;  Surgeon: Garnett Farm, MD;  Location: [facility];  Service: Urology;  Laterality: Left;  . KNEE ARTHROSCOPY Bilateral left 1984/   right 1996  . LEFT ELBOW RECONSTRUCTION COMMON EXTENSOR, LATERAL EPICONDYLE  01-24-2002  . TOTAL HIP ARTHROPLASTY Right 10-07-2004  . TOTAL HIP REVISION Right 2012  . TRANSTHORACIC ECHOCARDIOGRAM  11-26-2009   grade II diastolic dysfunction/  ef 55-60%/  mild AR & MR/  mild aortic sclerosis without stenosis   Prior to Admission medications   Medication Sig Start Date End Date Taking? Authorizing Provider  aspirin 81 MG chewable tablet Chew 81 mg by mouth daily.    [provider]  Diclofenac Sodium 1.5 % SOLN Place 1 mL onto the skin 2 (two) times daily. 03/03/17   Sherren Mocha, MD  Multiple Vitamins-Minerals (MULTIVITAMIN WITH MINERALS) tablet Take 1 tablet by mouth daily.    [provider]  Omega-3 Fatty Acids (FISH OIL) 1000 MG CAPS Take by mouth.    [provider]  pravastatin (PRAVACHOL) 40 MG tablet TAKE 1 TABLET BY MOUTH EVERY DAY 08/24/17   Sherren Mocha, MD  traMADol (ULTRAM) 50 MG tablet Take 1-2 tablets (50-100 mg total) by mouth every 6 (six) hours as needed for moderate pain. 04/01/17   Clelia Croft,  Levell July, MD   Allergies  Allergen Reactions  . Pollen Extract Other (See Comments)    Unspecified reaction   Family History  Problem Relation Age of Onset  . Hypertension Mother   . Mental illness Father    Social History   Socioeconomic History  . Marital status: Married    Spouse name: Not on file  . Number of children: Not on file  . Years of education: Not on file  . Highest education level: Not on file  Occupational History  . 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 Financial resource strain: Not on file  . Food insecurity:    Worry: Not on file     Inability: Not on file  . Transportation needs:    Medical: Not on file    Non-medical: Not on file  Tobacco Use  . Smoking status: Former Smoker    Packs/day: 1.00    Years: 20.00    Pack years: 20.00    Types: Cigarettes    Last attempt to quit: 01/31/1989    Years since quitting: 28.5  . Smokeless tobacco: Former Neurosurgeon    Types: Snuff, Dorna Bloom    Quit date: 01/31/1989  Substance and Sexual Activity  . Alcohol use: Yes    Alcohol/week: 7.2 oz    Types: 12 Standard drinks or equivalent per week  . Drug use: No  . Sexual activity: Yes  Lifestyle  . Physical activity:    Days per week: Not on file    Minutes per session: Not on file  . Stress: Not on file  Relationships  . Social connections:    Talks on phone: Not on file    Gets together: Not on file    Attends religious service: Not on file    Active member of club or organization: Not on file    Attends meetings of clubs or organizations: Not on file    Relationship status: Not on file  Other Topics Concern  . Not on file  Social History Narrative   Divorced.   Depression screen [facility] 2/9 08/30/2017 03/08/2017 03/01/2017 08/27/2016 03/12/2016  Decreased Interest 0 0 0 0 0  Down, Depressed, Hopeless 0 0 0 0 0  PHQ - 2 Score 0 0 0 0 0    Review of Systems  Constitutional: Negative for fatigue and unexpected weight change.  Eyes: Negative for visual disturbance.  Respiratory: Negative for cough, chest tightness and shortness of breath.   Cardiovascular: Negative for chest pain, palpitations and leg swelling.  Gastrointestinal: Positive for constipation. Negative for abdominal pain and blood in stool.  Neurological: Negative for dizziness, light-headedness and headaches.       Objective:   Physical Exam  Constitutional: He is oriented to person, place, and time. He appears well-developed and well-nourished. No distress.  HENT:  Head: Normocephalic and atraumatic.  Eyes: Pupils are equal, round, and reactive to light.  EOM are normal.  Neck: Neck supple.  Cardiovascular: Normal rate.  Pulmonary/Chest: Effort normal. No respiratory distress.  Musculoskeletal: Normal range of motion.  Neurological: He is alert and oriented to person, place, and time.  Skin: Skin is warm and dry.  Psychiatric: He has a normal mood and affect. His behavior is normal.  Nursing note and vitals reviewed.   BP (!) 151/79 (BP Location: Right Arm, Patient Position: Sitting, Cuff Size: Normal)   Pulse 79   Temp 98.4 F (36.9 C) (Oral)   Resp 16   Ht 5' 8.75" (  1.746 m)   Wt 185 lb (83.9 kg)   SpO2 97%   BMI 27.52 kg/m   EKG: NSR, no acute ischemic changes noted. No significant change noted when compared to prior EKG done 03/14/2015.   I have personally reviewed the EKG tracing and agree with the computer interpretation: Sinus  Bradycardia  -Short PR syndrome  PRi = 118 -Incomplete right bundle branch block.   -Nonspecific ST depression  -Nondiagnostic.   ABNORMAL      Assessment & Plan:   1. Pure hypercholesterolemia - notes that he is concerned that pravastatin 40 is causing alt constipation/diarrhea and cuasing him to overall feel poor - get fasting labs today now that has been on it for 6 mos - ok to stop pravastatin and see if all his complaints resolve w/in several weeks - if they do, will try pt on atorvastatin 20 instead. If they don't, then needs further eval to see what could be the cause and can restart statin.  2. Essential hypertension - not on meds prior but BP persistently elev.  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  No EKG changes, no cardiac sxs Start lisinopril 20 - in several wks, come by for lab-only bmp to ensure no Cr bump or electrolyte abnml since starting acei. Cont to monitor BP outside office and leave a message for me w/ readings to see if med dose/type needs adjusted.    3. Chronic pain syndrome - stable on tramadol 100 bid  4. Medication monitoring encounter     Orders Placed This Encounter  Procedures  . Comprehensive metabolic panel    Order Specific Question:   Has the patient fasted?    Answer:   Yes  . Lipid panel    Order Specific Question:   Has the patient fasted?    Answer:   Yes  . Basic metabolic panel    Standing Status:   Future    Standing Expiration Date:   08/31/2018    Order Specific Question:   Has the patient fasted?    Answer:   No  . EKG 12-Lead    Meds ordered this encounter  Medications  . traMADol (ULTRAM) 50 MG tablet    Sig: Take 1-2 tablets (50-100 mg total) by mouth every 6 (six) hours as needed for moderate pain.    Dispense:  120 tablet    Refill:  5  . lisinopril (PRINIVIL,ZESTRIL) 20 MG tablet    Sig: Take 1 tablet (20 mg total) by mouth daily.    Dispense:  90 tablet    Refill:  0    I personally performed the services described in this documentation, which was scribed in my presence. The recorded information has been reviewed and considered, and addended by me as needed.   Norberto SorensonEva Shaw, M.D.  [facility] at [facility]  [place] [address] [place], [place] [address] [phone] phone [phone] fax  09/01/17 9:44 AM

## 2017-09-01 ENCOUNTER — Encounter: Payer: Self-pay | Admitting: Family Medicine

## 2017-09-01 DIAGNOSIS — I10 Essential (primary) hypertension: Secondary | ICD-10-CM | POA: Insufficient documentation

## 2017-09-20 ENCOUNTER — Other Ambulatory Visit: Payer: Self-pay | Admitting: Family Medicine

## 2017-09-25 ENCOUNTER — Encounter: Payer: Self-pay | Admitting: Family Medicine

## 2017-11-15 ENCOUNTER — Ambulatory Visit (INDEPENDENT_AMBULATORY_CARE_PROVIDER_SITE_OTHER): Payer: 59 | Admitting: Family Medicine

## 2017-11-15 DIAGNOSIS — I10 Essential (primary) hypertension: Secondary | ICD-10-CM

## 2017-11-15 DIAGNOSIS — Z5181 Encounter for therapeutic drug level monitoring: Secondary | ICD-10-CM

## 2017-11-15 NOTE — Progress Notes (Signed)
Lab only visit 

## 2017-11-16 LAB — BASIC METABOLIC PANEL
BUN / CREAT RATIO: 16 (ref 10–24)
BUN: 15 mg/dL (ref 8–27)
CHLORIDE: 105 mmol/L (ref 96–106)
CO2: 25 mmol/L (ref 20–29)
Calcium: 9.5 mg/dL (ref 8.6–10.2)
Creatinine, Ser: 0.96 mg/dL (ref 0.76–1.27)
GFR calc non Af Amer: 86 mL/min/{1.73_m2} (ref >59)
GFR, EST AFRICAN AMERICAN: 99 mL/min/{1.73_m2} (ref >59)
Glucose: 84 mg/dL (ref 65–99)
POTASSIUM: 4.6 mmol/L (ref 3.5–5.2)
Sodium: 141 mmol/L (ref 134–144)

## 2017-11-23 ENCOUNTER — Telehealth: Payer: Self-pay | Admitting: Family Medicine

## 2017-11-23 NOTE — Telephone Encounter (Signed)
Copied from CRM [id number]. Topic: Quick Communication - See Telephone Encounter >> Nov 23, 2017 11:25 AM Mare LoanBurton, Donna F wrote: Pt has about 5 days of lisinopril left and is wanting to see about his lab results and if Dr. Clelia CroftShaw is going to change any thing with lisinopril based on the results  Best number [phone]

## 2017-11-24 ENCOUNTER — Other Ambulatory Visit: Payer: Self-pay | Admitting: Family Medicine

## 2017-11-24 NOTE — Telephone Encounter (Signed)
lisinopril refill Last Refill:08/30/17 # 90 Last OV: 08/30/17 PCP: Norberto SorensonEva Shaw MD Pharmacy:[facility] [address]

## 2017-11-25 NOTE — Telephone Encounter (Signed)
[facility] but unsure if Dr. Clelia CroftShaw saw note if she will change dosage after labs. Message sent to Dr. Clelia CroftShaw.

## 2018-02-03 ENCOUNTER — Telehealth: Payer: Self-pay | Admitting: Family Medicine

## 2018-02-03 NOTE — Telephone Encounter (Signed)
Addendum. Did not reschedule. Left Detail VM to call back. If pt. Does call back please reschedule with Dr. Leretha PolSantiago

## 2018-02-03 NOTE — Telephone Encounter (Signed)
Called pt to reschedule visit with Dr. Clelia CroftShaw on 02/28/18.  Rescheduled with pt.

## 2018-02-28 ENCOUNTER — Ambulatory Visit: Payer: 59 | Admitting: Family Medicine

## 2018-02-28 ENCOUNTER — Other Ambulatory Visit: Payer: Self-pay

## 2018-02-28 ENCOUNTER — Encounter: Payer: Self-pay | Admitting: Family Medicine

## 2018-02-28 VITALS — BP 139/78 | HR 64 | Temp 97.6°F | Ht 69.5 in | Wt 182.2 lb

## 2018-02-28 DIAGNOSIS — I10 Essential (primary) hypertension: Secondary | ICD-10-CM | POA: Diagnosis not present

## 2018-02-28 DIAGNOSIS — E7800 Pure hypercholesterolemia, unspecified: Secondary | ICD-10-CM | POA: Diagnosis not present

## 2018-02-28 DIAGNOSIS — Z23 Encounter for immunization: Secondary | ICD-10-CM

## 2018-02-28 DIAGNOSIS — G894 Chronic pain syndrome: Secondary | ICD-10-CM

## 2018-02-28 DIAGNOSIS — Z5181 Encounter for therapeutic drug level monitoring: Secondary | ICD-10-CM | POA: Diagnosis not present

## 2018-02-28 DIAGNOSIS — E875 Hyperkalemia: Secondary | ICD-10-CM

## 2018-02-28 LAB — LIPID PANEL
Chol/HDL Ratio: 3.1 ratio (ref 0.0–5.0)
Cholesterol, Total: 214 mg/dL — ABNORMAL HIGH (ref 100–199)
HDL: 68 mg/dL (ref >39)
LDL Calculated: 132 mg/dL — ABNORMAL HIGH (ref 0–99)
Triglycerides: 68 mg/dL (ref 0–149)
VLDL Cholesterol Cal: 14 mg/dL (ref 5–40)

## 2018-02-28 LAB — CMP14+EGFR
ALT: 16 IU/L (ref 0–44)
AST: 22 IU/L (ref 0–40)
Albumin/Globulin Ratio: 2 (ref 1.2–2.2)
Albumin: 4.4 g/dL (ref 3.6–4.8)
Alkaline Phosphatase: 53 IU/L (ref 39–117)
BUN/Creatinine Ratio: 12 (ref 10–24)
BUN: 12 mg/dL (ref 8–27)
Bilirubin Total: 0.8 mg/dL (ref 0.0–1.2)
CO2: 25 mmol/L (ref 20–29)
Calcium: 9.7 mg/dL (ref 8.6–10.2)
Chloride: 104 mmol/L (ref 96–106)
Creatinine, Ser: 1.02 mg/dL (ref 0.76–1.27)
GFR calc Af Amer: 92 mL/min/{1.73_m2} (ref >59)
GFR calc non Af Amer: 80 mL/min/{1.73_m2} (ref >59)
Globulin, Total: 2.2 g/dL (ref 1.5–4.5)
Glucose: 101 mg/dL — ABNORMAL HIGH (ref 65–99)
Potassium: 5.7 mmol/L — ABNORMAL HIGH (ref 3.5–5.2)
Sodium: 141 mmol/L (ref 134–144)
Total Protein: 6.6 g/dL (ref 6.0–8.5)

## 2018-02-28 MED ORDER — LISINOPRIL 20 MG PO TABS: 20.0000 mg | ORAL_TABLET | Freq: Every day | ORAL | 1 refills | Status: DC

## 2018-02-28 MED ORDER — TRAMADOL HCL 50 MG PO TABS: 100.0000 mg | ORAL_TABLET | Freq: Two times a day (BID) | ORAL | 5 refills | Status: DC

## 2018-02-28 NOTE — Patient Instructions (Signed)
° ° ° °  If you have lab work done today you will be contacted with your lab results within the next 2 weeks.  If you have not heard from us then please contact us. The fastest way to get your results is to register for My Chart. ° ° °IF you received an x-ray today, you will receive an invoice from [facility]. Please contact [facility] at [phone] with questions or concerns regarding your invoice.  ° °IF you received labwork today, you will receive an invoice from [facility]. Please contact [facility] at [phone] with questions or concerns regarding your invoice.  ° °Our billing staff will not be able to assist you with questions regarding bills from these companies. ° °You will be contacted with the lab results as soon as they are available. The fastest way to get your results is to activate your My Chart account. Instructions are located on the last page of this paperwork. If you have not heard from us regarding the results in 2 weeks, please contact this office. °  ° ° ° °

## 2018-02-28 NOTE — Progress Notes (Signed)
10/7/20199:18 AM  Jay Davidson 14-Oct-1955, 63 y.o. male [id number]  Chief Complaint  Patient presents with  . Hypertension    has been on lisinopril, needs refill. medication helps with bp. Has nt taken in 3 days due to no meds    HPI:   Patient is a 62 y.o. male with past medical history significant for HLP, OA hip, HTN who presents today for routine followup  PCP Dr Brigitte Pulse Last visit June 2019  Last LDL 104,  Was on statin, had to stop pravastatin due to constipation Fish oil taking daily Follows a pescetarian diet HLP runs high on family  Has been wo bp meds for 3-4 days Tolerates lisinopril well  S/p bilateral hip replacements, has had surgeries in elbows and knees Takes tramadol 2 tab BID pmp reviewed  Flu vaccine today  Has no acute concerns today  Fall Risk  02/28/2018 02/28/2018 08/30/2017 03/08/2017 03/01/2017  Falls in the past year? No No No Yes Yes  Number falls in past yr: - - - 2 or more 2 or more  Injury with Fall? - - - - Yes  Comment - - - - #1 ribs  #2 right elbow     Depression screen [facility] 2/9 02/28/2018 02/28/2018 08/30/2017  Decreased Interest 0 0 0  Down, Depressed, Hopeless 0 0 0  PHQ - 2 Score 0 0 0    Allergies  Allergen Reactions  . Pollen Extract Other (See Comments)    Unspecified reaction    Prior to Admission medications   Medication Sig Start Date End Date Taking? Authorizing Provider  aspirin 81 MG chewable tablet Chew 81 mg by mouth daily.   Yes [provider]  lisinopril (PRINIVIL,ZESTRIL) 20 MG tablet TAKE 1 TABLET BY MOUTH EVERY DAY 11/24/17  Yes Shawnee Knapp, MD  Multiple Vitamins-Minerals (MULTIVITAMIN WITH MINERALS) tablet Take 1 tablet by mouth daily.   Yes [provider]  Omega-3 Fatty Acids (FISH OIL) 1000 MG CAPS Take by mouth.   Yes [provider]  traMADol (ULTRAM) 50 MG tablet Take 1-2 tablets (50-100 mg total) by mouth every 6 (six) hours as needed for moderate pain. 08/30/17  Yes Shawnee Knapp,  MD    Past Medical History:  Diagnosis Date  . Asymptomatic cholelithiasis    chronic  . At risk for sleep apnea    STOP-BANG= 4  . History of diverticulitis of colon   . Incomplete RBBB   . Left ureteral calculus 11/2012, 01/2014   recurrent seen by urology Dr. Karsten Ro 01/2014  . OA (osteoarthritis)     Past Surgical History:  Procedure Laterality Date  . CYSTOSCOPY WITH URETEROSCOPY AND STENT PLACEMENT Left 02/02/2014   Procedure: CYSTOSCOPY WITH LEFT RETRGRADE, URETEROSCOPY AND STENT PLACEMENT;  Surgeon: Claybon Jabs, MD;  Location: [facility];  Service: Urology;  Laterality: Left;  . ELBOW SURGERY Right 2001  . HOLMIUM LASER APPLICATION Left 8/75/6433   Procedure: HOLMIUM LASER APPLICATION;  Surgeon: Claybon Jabs, MD;  Location: [facility];  Service: Urology;  Laterality: Left;  . KNEE ARTHROSCOPY Bilateral left 1984/   right 1996  . LEFT ELBOW RECONSTRUCTION COMMON EXTENSOR, LATERAL EPICONDYLE  01-24-2002  . TOTAL HIP ARTHROPLASTY Right 10-07-2004  . TOTAL HIP REVISION Right 2012  . TRANSTHORACIC ECHOCARDIOGRAM  11-26-2009   grade II diastolic dysfunction/  ef 55-60%/  mild AR & MR/  mild aortic sclerosis without stenosis    Social History   Tobacco  Use  . Smoking status: Former Smoker    Packs/day: 1.00    Years: 20.00    Pack years: 20.00    Types: Cigarettes    Last attempt to quit: 01/31/1989    Years since quitting: 29.0  . Smokeless tobacco: Former Systems developer    Types: Snuff, Sarina Ser    Quit date: 01/31/1989  Substance Use Topics  . Alcohol use: Yes    Alcohol/week: 12.0 standard drinks    Types: 12 Standard drinks or equivalent per week    Family History  Problem Relation Age of Onset  . Hypertension Mother   . Mental illness Father     Review of Systems  Constitutional: Negative for chills and fever.  Respiratory: Negative for cough and shortness of breath.   Cardiovascular: Negative for chest pain, palpitations and leg  swelling.  Gastrointestinal: Negative for abdominal pain, constipation, nausea and vomiting.  Musculoskeletal: Positive for joint pain.     OBJECTIVE:  Blood pressure 139/78, pulse 64, temperature 97.6 F (36.4 C), temperature source Oral, height 5' 9.5" (1.765 m), weight 182 lb 3.2 oz (82.6 kg), SpO2 96 %. Body mass index is 26.52 kg/m.   Physical Exam  Constitutional: He is oriented to person, place, and time. He appears well-developed and well-nourished.  HENT:  Head: Normocephalic and atraumatic.  Mouth/Throat: Oropharynx is clear and moist.  Eyes: Pupils are equal, round, and reactive to light. Conjunctivae and EOM are normal.  Neck: Neck supple.  Cardiovascular: Normal rate and regular rhythm. Exam reveals no gallop and no friction rub.  No murmur heard. Pulmonary/Chest: Effort normal and breath sounds normal. He has no wheezes. He has no rales.  Musculoskeletal: He exhibits no edema.  Neurological: He is alert and oriented to person, place, and time.  Skin: Skin is warm and dry.  Psychiatric: He has a normal mood and affect.  Nursing note and vitals reviewed.   ASSESSMENT and PLAN  1. Essential hypertension Controlled. Continue current regime.   2. Need for prophylactic vaccination and inoculation against influenza - Flu Vaccine QUAD 36+ mos IM  3. Medication monitoring encounter - ToxASSURE Select 13 (MW), Urine  4. Pure hypercholesterolemia Checking labs today, medications will be adjusted as needed. Consider atorvastatin 40m - Lipid panel - CMP14+EGFR  5. Chronic pain syndrome Controlled. Continue current regime.   Other orders - lisinopril (PRINIVIL,ZESTRIL) 20 MG tablet; Take 1 tablet (20 mg total) by mouth daily. - traMADol (ULTRAM) 50 MG tablet; Take 2 tablets (100 mg total) by mouth 2 (two) times daily.    Return in about 6 months (around 08/30/2018) for Dr SBrigitte Pulse    IRutherford Guys MD [facility]  [id number].  [phone] [phone]

## 2018-03-04 LAB — TOXASSURE SELECT 13 (MW), URINE

## 2018-03-16 NOTE — Addendum Note (Signed)
Addended by: Myles Lipps on: 03/16/2018 01:27 PM   Modules accepted: Orders

## 2018-07-28 ENCOUNTER — Telehealth: Payer: Self-pay | Admitting: Family Medicine

## 2018-07-28 NOTE — Telephone Encounter (Signed)
Left a VM in regards to his appt with Dr. Clelia Croft on 08/29/2018. The provider is no longer with our practice and so his appt has been cancelled.

## 2018-07-29 ENCOUNTER — Telehealth: Payer: Self-pay | Admitting: General Practice

## 2018-07-29 NOTE — Telephone Encounter (Signed)
Called pt. And informed via voice mail them of Dr. Shaw's sudden absence and the necessity to cancel their appt scheduled with her. Pt. Was given her new practices information and was offered the opportunity to reschedule with [facility]. °[place] Address and phone number  °[address] °[place] [address] °Phone: [phone] °

## 2018-08-20 ENCOUNTER — Other Ambulatory Visit: Payer: Self-pay | Admitting: Family Medicine

## 2018-08-29 ENCOUNTER — Other Ambulatory Visit: Payer: Self-pay | Admitting: Family Medicine

## 2018-08-29 ENCOUNTER — Ambulatory Visit: Payer: 59 | Admitting: Family Medicine

## 2018-08-29 ENCOUNTER — Telehealth: Payer: Self-pay | Admitting: Family Medicine

## 2018-08-29 NOTE — Telephone Encounter (Signed)
Please review for refill of Tramadol 50 mg (controlled medication). LOV 02/28/18 by Dr. Leretha Pol Needs office visit before refilling.  Historical provider  Dr. Clelia Croft

## 2018-08-29 NOTE — Telephone Encounter (Signed)
Unable to leave msg. Patient need to be seen for any further refills

## 2018-08-29 NOTE — Telephone Encounter (Signed)
Copied from CRM [id number]. Topic: Quick Communication - Rx Refill/Question >> Aug 29, 2018  8:11 AM Reggie Pile, NT wrote: Medication: traMADol (ULTRAM) 50 MG tablet  Has the patient contacted their pharmacy? Yes patient had an appointment for 4/6 however it was canceled and he needs this medication due to being on his last pill.  Preferred Pharmacy (with phone number or street name): [id number] - [place], [place] - [address] AT [address] [facility] [address] [phone] (Phone) [phone] (Fax)  Agent: Please be advised that RX refills may take up to 3 business days. We ask that you follow-up with your pharmacy.

## 2018-10-05 ENCOUNTER — Other Ambulatory Visit: Payer: Self-pay

## 2018-10-05 ENCOUNTER — Encounter: Payer: Self-pay | Admitting: Family Medicine

## 2018-10-05 ENCOUNTER — Telehealth (INDEPENDENT_AMBULATORY_CARE_PROVIDER_SITE_OTHER): Payer: 59 | Admitting: Family Medicine

## 2018-10-05 ENCOUNTER — Telehealth: Payer: Self-pay | Admitting: Family Medicine

## 2018-10-05 DIAGNOSIS — G894 Chronic pain syndrome: Secondary | ICD-10-CM

## 2018-10-05 DIAGNOSIS — I10 Essential (primary) hypertension: Secondary | ICD-10-CM | POA: Diagnosis not present

## 2018-10-05 DIAGNOSIS — E7800 Pure hypercholesterolemia, unspecified: Secondary | ICD-10-CM

## 2018-10-05 DIAGNOSIS — Z96641 Presence of right artificial hip joint: Secondary | ICD-10-CM

## 2018-10-05 DIAGNOSIS — E875 Hyperkalemia: Secondary | ICD-10-CM

## 2018-10-05 MED ORDER — TRAMADOL HCL 50 MG PO TABS: 100.0000 mg | ORAL_TABLET | Freq: Two times a day (BID) | ORAL | 2 refills | Status: DC

## 2018-10-05 MED ORDER — LISINOPRIL 20 MG PO TABS: 20.0000 mg | ORAL_TABLET | Freq: Every day | ORAL | 0 refills | Status: DC

## 2018-10-05 NOTE — Progress Notes (Signed)
Virtual Visit Note  I connected with patent on 10/05/18 at 254pm by phone and verified that I am speaking with the correct person using two identifiers. Jay LeatherwoodJon H Davidson is currently located at home and patient is currently with them during visit. The provider, Myles LippsIrma M Santiago, MD is located in their office at time of visit.  I discussed the limitations, risks, security and privacy concerns of performing an evaluation and management service by telephone and the availability of in person appointments. I also discussed with the patient that there may be a patient responsible charge related to this service. The patient expressed understanding and agreed to proceed.   CC: routine followup  HPI ? Patient is a 63 y.o. 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, since he stopped tramadol, had issues with refills Prior his pain and function did well on tramadol 100mg  BID Reports weight has been stable Denies any fever, chills, chest pain, SOB, edema Does not smoke Follows pescetarian diet, has cut back on seafood due to cholesterol Does not snore pmp reviewed Taking all meds as prescribed, denies any side effects  Lab Results  Component Value Date   CHOL 214 (H) 02/28/2018   HDL 68 02/28/2018   LDLCALC 132 (H) 02/28/2018   TRIG 68 02/28/2018   CHOLHDL 3.1 02/28/2018   Lab Results  Component Value Date   CREATININE 1.02 02/28/2018   BUN 12 02/28/2018   NA 141 02/28/2018   K 5.7 (H) 02/28/2018   CL 104 02/28/2018   CO2 25 02/28/2018    BP Readings from Last 3 Encounters:  02/28/18 139/78  08/30/17 (!) 151/79  03/08/17 (!) 180/80    Allergies  Allergen Reactions  . Pollen Extract Other (See Comments)    Unspecified reaction    Prior to Admission medications    Medication Sig Start Date End Date Taking? Authorizing Provider  aspirin 81 MG chewable tablet Chew 81 mg by mouth daily.   Yes [provider]  lisinopril (PRINIVIL,ZESTRIL) 20 MG tablet TAKE 1 TABLET BY MOUTH EVERY DAY 08/20/18  Yes Sherren MochaShaw, Eva N, MD  Multiple Vitamins-Minerals (MULTIVITAMIN WITH MINERALS) tablet Take 1 tablet by mouth daily.   Yes [provider]  Omega-3 Fatty Acids (FISH OIL) 1000 MG CAPS Take by mouth.   Yes [provider]  traMADol (ULTRAM) 50 MG tablet Take 2 tablets (100 mg total) by mouth 2 (two) times daily. 02/28/18  Yes [name], [name], MD    Past Medical History:  Diagnosis Date  . Asymptomatic cholelithiasis    chronic  . At risk for sleep apnea    STOP-BANG= 4  . History of diverticulitis of colon   . Incomplete RBBB   . Left ureteral calculus 11/2012, 01/2014   recurrent seen by urology Dr. Vernie Ammonsttelin 01/2014  . OA (osteoarthritis)     Past Surgical History:  Procedure Laterality Date  . CYSTOSCOPY WITH URETEROSCOPY AND STENT PLACEMENT Left 02/02/2014   Procedure: CYSTOSCOPY WITH LEFT RETRGRADE, URETEROSCOPY AND STENT PLACEMENT;  Surgeon: Garnett FarmMark C Ottelin, MD;  [facility];  Service: Urology;  Laterality: Left;  . ELBOW SURGERY Right 2001  . HOLMIUM LASER APPLICATION Left 02/02/2014   Procedure: HOLMIUM LASER APPLICATION;  Surgeon:  Garnett Farm, MD;  Location: [facility];  Service: Urology;  Laterality: Left;  . KNEE ARTHROSCOPY Bilateral left 1984/   right 1996  . LEFT ELBOW RECONSTRUCTION COMMON EXTENSOR, LATERAL EPICONDYLE  01-24-2002  . TOTAL HIP ARTHROPLASTY Right 10-07-2004  . TOTAL HIP REVISION Right 2012  . TRANSTHORACIC ECHOCARDIOGRAM  11-26-2009   grade II diastolic dysfunction/  ef 55-60%/  mild AR & MR/  mild aortic sclerosis without stenosis    Social History   Tobacco Use  . Smoking status: Former Smoker    Packs/day: 1.00    Years: 20.00    Pack years: 20.00     Types: Cigarettes    Last attempt to quit: 01/31/1989    Years since quitting: 29.6  . Smokeless tobacco: Former Neurosurgeon    Types: Snuff, Dorna Bloom    Quit date: 01/31/1989  Substance Use Topics  . Alcohol use: Yes    Alcohol/week: 12.0 standard drinks    Types: 12 Standard drinks or equivalent per week    Family History  Problem Relation Age of Onset  . Hypertension Mother   . Mental illness Father     ROS Per hpi  Objective  Vitals as reported by the patient: none   ASSESSMENT and PLAN  1. Essential hypertension BP check with lab visit, might be slightly above goal, consider adding HCTZ - CBC; Future - TSH; Future  2. Chronic pain syndrome 3. History of total right hip replacement Restarting tramadol, pmp reviewed. Reviewed med r/se/b  4. Pure hypercholesterolemia Manages with diet, labs pending - Lipid panel; Future  5. Hyperkalemia Rechecking labs, might need to change ACE - Comprehensive metabolic panel; Future  Other orders - traMADol (ULTRAM) 50 MG tablet; Take 2 tablets (100 mg total) by mouth 2 (two) times daily. - lisinopril (ZESTRIL) 20 MG tablet; Take 1 tablet (20 mg total) by mouth daily.  FOLLOW-UP: fasting labs and BP check within 1 week, followup with me in 3 months   The above assessment and management plan was discussed with the patient. The patient verbalized understanding of and has agreed to the management plan. Patient is aware to call the clinic if symptoms persist or worsen. Patient is aware when to return to the clinic for a follow-up visit. Patient educated on when it is appropriate to go to the emergency department.    I provided 15 minutes of non-face-to-face time during this encounter.  Myles Lipps, MD Primary Care at [facility] [address] [place], [place] [address] Ph.  [phone] Fax [phone]

## 2018-10-05 NOTE — Progress Notes (Signed)
Pt c/o TPC from Dr. Clelia Croft. Also needs a medication refill of tramadol and lisinopril.

## 2018-10-05 NOTE — Telephone Encounter (Addendum)
10/05/2018 - PATIENT HAD A TELEMED VISIT WITH DR. Darcel Bayley TO TRANSFER HIS CARE TO HER. DR. IRMA HAS REQUESTED HE RETURN WITHIN 1 WEEK TO HAVE HIS BLOOD DRAWN (ORDERS ARE IN) AND 3 MONTHS FOR HIS FOLLOW-UP. I TRIED TO CALL AND SCHEDULE BUT HAD TO LEAVE HIM A VOICE MAIL TO RETURN OUR CALL. MBC

## 2018-10-12 ENCOUNTER — Ambulatory Visit (INDEPENDENT_AMBULATORY_CARE_PROVIDER_SITE_OTHER): Payer: 59 | Admitting: Family Medicine

## 2018-10-12 ENCOUNTER — Other Ambulatory Visit: Payer: Self-pay

## 2018-10-12 DIAGNOSIS — I10 Essential (primary) hypertension: Secondary | ICD-10-CM

## 2018-10-12 DIAGNOSIS — E875 Hyperkalemia: Secondary | ICD-10-CM

## 2018-10-12 DIAGNOSIS — E7800 Pure hypercholesterolemia, unspecified: Secondary | ICD-10-CM

## 2018-10-12 NOTE — Progress Notes (Signed)
Lab visit only.  Not seen by provider.  

## 2018-10-13 LAB — COMPREHENSIVE METABOLIC PANEL
ALT: 20 IU/L (ref 0–44)
AST: 31 IU/L (ref 0–40)
Albumin/Globulin Ratio: 2.4 — ABNORMAL HIGH (ref 1.2–2.2)
Albumin: 4.8 g/dL (ref 3.8–4.8)
Alkaline Phosphatase: 51 IU/L (ref 39–117)
BUN/Creatinine Ratio: 14 (ref 10–24)
BUN: 13 mg/dL (ref 8–27)
Bilirubin Total: 0.7 mg/dL (ref 0.0–1.2)
CO2: 26 mmol/L (ref 20–29)
Calcium: 9.9 mg/dL (ref 8.6–10.2)
Chloride: 99 mmol/L (ref 96–106)
Creatinine, Ser: 0.92 mg/dL (ref 0.76–1.27)
GFR calc Af Amer: 103 mL/min/{1.73_m2} (ref >59)
GFR calc non Af Amer: 89 mL/min/{1.73_m2} (ref >59)
Globulin, Total: 2 g/dL (ref 1.5–4.5)
Glucose: 91 mg/dL (ref 65–99)
Potassium: 5.1 mmol/L (ref 3.5–5.2)
Sodium: 138 mmol/L (ref 134–144)
Total Protein: 6.8 g/dL (ref 6.0–8.5)

## 2018-10-13 LAB — CBC
Hematocrit: 42.1 % (ref 37.5–51.0)
Hemoglobin: 14.4 g/dL (ref 13.0–17.7)
MCH: 31.4 pg (ref 26.6–33.0)
MCHC: 34.2 g/dL (ref 31.5–35.7)
MCV: 92 fL (ref 79–97)
Platelets: 248 10*3/uL (ref 150–450)
RBC: 4.58 x10E6/uL (ref 4.14–5.80)
RDW: 12.2 % (ref 11.6–15.4)
WBC: 4.4 10*3/uL (ref 3.4–10.8)

## 2018-10-13 LAB — LIPID PANEL
Chol/HDL Ratio: 3.8 ratio (ref 0.0–5.0)
Cholesterol, Total: 259 mg/dL — ABNORMAL HIGH (ref 100–199)
HDL: 68 mg/dL (ref >39)
LDL Calculated: 170 mg/dL — ABNORMAL HIGH (ref 0–99)
Triglycerides: 107 mg/dL (ref 0–149)
VLDL Cholesterol Cal: 21 mg/dL (ref 5–40)

## 2018-10-13 LAB — TSH: TSH: 2.15 u[IU]/mL (ref 0.450–4.500)

## 2018-10-19 MED ORDER — ATORVASTATIN CALCIUM 40 MG PO TABS: 40.0000 mg | ORAL_TABLET | Freq: Every day | ORAL | 3 refills | Status: DC

## 2018-10-19 NOTE — Addendum Note (Signed)
Addended by: Myles Lipps on: 10/19/2018 09:56 PM   Modules accepted: Orders

## 2018-11-13 ENCOUNTER — Encounter: Payer: Self-pay | Admitting: Family Medicine

## 2018-11-15 MED ORDER — ROSUVASTATIN CALCIUM 5 MG PO TABS: 5.0000 mg | ORAL_TABLET | Freq: Every day | ORAL | 2 refills | Status: DC

## 2018-11-23 ENCOUNTER — Encounter: Payer: Self-pay | Admitting: Family Medicine

## 2018-11-24 ENCOUNTER — Other Ambulatory Visit: Payer: Self-pay | Admitting: *Deleted

## 2018-11-24 MED ORDER — LISINOPRIL 20 MG PO TABS: 20.0000 mg | ORAL_TABLET | Freq: Every day | ORAL | 0 refills | Status: DC

## 2019-01-06 ENCOUNTER — Ambulatory Visit (INDEPENDENT_AMBULATORY_CARE_PROVIDER_SITE_OTHER): Payer: 59 | Admitting: Family Medicine

## 2019-01-06 ENCOUNTER — Encounter: Payer: Self-pay | Admitting: Family Medicine

## 2019-01-06 ENCOUNTER — Other Ambulatory Visit: Payer: Self-pay

## 2019-01-06 VITALS — BP 138/71 | HR 69 | Temp 98.8°F | Ht 69.0 in | Wt 184.0 lb

## 2019-01-06 DIAGNOSIS — G894 Chronic pain syndrome: Secondary | ICD-10-CM | POA: Diagnosis not present

## 2019-01-06 DIAGNOSIS — M159 Polyosteoarthritis, unspecified: Secondary | ICD-10-CM | POA: Insufficient documentation

## 2019-01-06 DIAGNOSIS — E7800 Pure hypercholesterolemia, unspecified: Secondary | ICD-10-CM | POA: Diagnosis not present

## 2019-01-06 DIAGNOSIS — I10 Essential (primary) hypertension: Secondary | ICD-10-CM

## 2019-01-06 DIAGNOSIS — E875 Hyperkalemia: Secondary | ICD-10-CM

## 2019-01-06 MED ORDER — TRAMADOL HCL 50 MG PO TABS: 100.0000 mg | ORAL_TABLET | Freq: Two times a day (BID) | ORAL | 5 refills | Status: DC

## 2019-01-06 NOTE — Progress Notes (Signed)
8/14/20208:05 AM  Jay Davidson 02-11-1956, 63 y.o., male [id number]  Chief Complaint  Patient presents with  . chronic condition    F/u     HPI:   Patient is a 63 y.o. male with past medical history significant for HLP, OA hips and left knee, HTNwho presents today for routine followup  Last OV May 2020 - telemedicine Restarted tramadol for multiple joint OA - taking 100mg  BID, provides appropriate relief for him to go with activities, pmp reviewed Started on atorvastatin 40mg  but he did not tolerate due to myalgia so changed to crestor 5mg  which he is tolerating well Otherwise he is doing really well Denies any acute concerns  Lab Results  Component Value Date   CREATININE 0.92 10/12/2018   BUN 13 10/12/2018   NA 138 10/12/2018   K 5.1 10/12/2018   CL 99 10/12/2018   CO2 26 10/12/2018   Lab Results  Component Value Date   CHOL 259 (H) 10/12/2018   HDL 68 10/12/2018   LDLCALC 170 (H) 10/12/2018   TRIG 107 10/12/2018   CHOLHDL 3.8 10/12/2018   Depression screen PHQ 2/9 01/06/2019 10/05/2018 02/28/2018  Decreased Interest 0 0 0  Down, Depressed, Hopeless 0 0 0  PHQ - 2 Score 0 0 0    Fall Risk  01/06/2019 10/05/2018 02/28/2018 02/28/2018 08/30/2017  Falls in the past year? 0 0 No No No  Number falls in past yr: 0 - - - -  Injury with Fall? 0 - - - -  Comment - - - - -  Follow up Falls evaluation completed - - - -     Allergies  Allergen Reactions  . Pollen Extract Other (See Comments)    Unspecified reaction    Prior to Admission medications   Medication Sig Start Date End Date Taking? Authorizing Provider  aspirin 81 MG chewable tablet Chew 81 mg by mouth daily.   Yes [provider]  lisinopril (ZESTRIL) 20 MG tablet Take 1 tablet (20 mg total) by mouth daily. 11/24/18  Yes Rutherford Guys, MD  Multiple Vitamins-Minerals (MULTIVITAMIN WITH MINERALS) tablet Take 1 tablet by mouth daily.   Yes [provider]  Omega-3 Fatty Acids (FISH OIL)  1000 MG CAPS Take by mouth.   Yes [provider]  rosuvastatin (CRESTOR) 5 MG tablet Take 1 tablet (5 mg total) by mouth daily. 11/15/18  Yes Rutherford Guys, MD  traMADol (ULTRAM) 50 MG tablet Take 2 tablets (100 mg total) by mouth 2 (two) times daily. 10/05/18  Yes Rutherford Guys, MD    Past Medical History:  Diagnosis Date  . Asymptomatic cholelithiasis    chronic  . At risk for sleep apnea    STOP-BANG= 4  . History of diverticulitis of colon   . Incomplete RBBB   . Left ureteral calculus 11/2012, 01/2014   recurrent seen by urology Dr. Karsten Ro 01/2014  . OA (osteoarthritis)     Past Surgical History:  Procedure Laterality Date  . CYSTOSCOPY WITH URETEROSCOPY AND STENT PLACEMENT Left 02/02/2014   Procedure: CYSTOSCOPY WITH LEFT RETRGRADE, URETEROSCOPY AND STENT PLACEMENT;  Surgeon: Claybon Jabs, MD;  Location: [facility];  Service: Urology;  Laterality: Left;  . ELBOW SURGERY Right 2001  . HOLMIUM LASER APPLICATION Left 8/52/7782   Procedure: HOLMIUM LASER APPLICATION;  Surgeon: Claybon Jabs, MD;  Location: [facility];  Service: Urology;  Laterality: Left;  . KNEE ARTHROSCOPY Bilateral left 1984/  right 1996  . LEFT ELBOW RECONSTRUCTION COMMON EXTENSOR, LATERAL EPICONDYLE  01-24-2002  . TOTAL HIP ARTHROPLASTY Right 10-07-2004  . TOTAL HIP REVISION Right 2012  . TRANSTHORACIC ECHOCARDIOGRAM  11-26-2009   grade II diastolic dysfunction/  ef 55-60%/  mild AR & MR/  mild aortic sclerosis without stenosis    Social History   Tobacco Use  . Smoking status: Former Smoker    Packs/day: 1.00    Years: 20.00    Pack years: 20.00    Types: Cigarettes    Quit date: 01/31/1989    Years since quitting: 29.9  . Smokeless tobacco: Former NeurosurgeonUser    Types: Snuff, Dorna BloomChew    Quit date: 01/31/1989  Substance Use Topics  . Alcohol use: Yes    Alcohol/week: 12.0 standard drinks    Types: 12 Standard drinks or equivalent per week    Family History   Problem Relation Age of Onset  . Hypertension Mother   . Mental illness Father     Review of Systems  Constitutional: Negative for chills and fever.  Respiratory: Negative for cough and shortness of breath.   Cardiovascular: Negative for chest pain, palpitations and leg swelling.  Gastrointestinal: Negative for abdominal pain, nausea and vomiting.     OBJECTIVE:  Today's Vitals   01/06/19 0801  BP: 138/71  Pulse: 69  Temp: 98.8 F (37.1 C)  TempSrc: Oral  SpO2: 97%  Weight: 184 lb (83.5 kg)  Height: 5\' 9"  (1.753 m)   Body mass index is 27.17 kg/m.   Physical Exam Vitals signs and nursing note reviewed.  Constitutional:      Appearance: He is well-developed.  HENT:     Head: Normocephalic and atraumatic.  Eyes:     Conjunctiva/sclera: Conjunctivae normal.     Pupils: Pupils are equal, round, and reactive to light.  Neck:     Musculoskeletal: Neck supple.  Cardiovascular:     Rate and Rhythm: Normal rate and regular rhythm.     Heart sounds: No murmur. No friction rub. No gallop.   Pulmonary:     Effort: Pulmonary effort is normal.     Breath sounds: Normal breath sounds. No wheezing or rales.  Skin:    General: Skin is warm and dry.  Neurological:     Mental Status: He is alert and oriented to person, place, and time.      ASSESSMENT and PLAN  1. Pure hypercholesterolemia Tolerating crestor well. Checking labs today, medications will be adjusted as needed.   2. Chronic pain syndrome 3. Osteoarthritis of multiple joints, unspecified osteoarthritis type Controlled. Continue current regime. pmp reviewed  4. Essential hypertension Controlled. Continue current regime.   Other orders - traMADol (ULTRAM) 50 MG tablet; Take 2 tablets (100 mg total) by mouth 2 (two) times daily.  Return in about 6 months (around 07/09/2019) for routine followup.    Myles LippsIrma M Santiago, MD [facility] [address] [facility] [address] [place], [place] [address] Ph.  [phone]  Fax [phone]

## 2019-01-06 NOTE — Patient Instructions (Signed)
° ° ° °  If you have lab work done today you will be contacted with your lab results within the next 2 weeks.  If you have not heard from us then please contact us. The fastest way to get your results is to register for My Chart. ° ° °IF you received an x-ray today, you will receive an invoice from [facility]. Please contact [facility] at [phone] with questions or concerns regarding your invoice.  ° °IF you received labwork today, you will receive an invoice from [facility]. Please contact [facility] at [phone] with questions or concerns regarding your invoice.  ° °Our billing staff will not be able to assist you with questions regarding bills from these companies. ° °You will be contacted with the lab results as soon as they are available. The fastest way to get your results is to activate your My Chart account. Instructions are located on the last page of this paperwork. If you have not heard from us regarding the results in 2 weeks, please contact this office. °  ° ° ° °

## 2019-01-07 LAB — COMPREHENSIVE METABOLIC PANEL
ALT: 21 IU/L (ref 0–44)
AST: 25 IU/L (ref 0–40)
Albumin/Globulin Ratio: 2 (ref 1.2–2.2)
Albumin: 4.7 g/dL (ref 3.8–4.8)
Alkaline Phosphatase: 57 IU/L (ref 39–117)
BUN/Creatinine Ratio: 12 (ref 10–24)
BUN: 11 mg/dL (ref 8–27)
Bilirubin Total: 1.4 mg/dL — ABNORMAL HIGH (ref 0.0–1.2)
CO2: 22 mmol/L (ref 20–29)
Calcium: 9.7 mg/dL (ref 8.6–10.2)
Chloride: 103 mmol/L (ref 96–106)
Creatinine, Ser: 0.95 mg/dL (ref 0.76–1.27)
GFR calc Af Amer: 99 mL/min/{1.73_m2} (ref >59)
GFR calc non Af Amer: 86 mL/min/{1.73_m2} (ref >59)
Globulin, Total: 2.3 g/dL (ref 1.5–4.5)
Glucose: 95 mg/dL (ref 65–99)
Potassium: 5.2 mmol/L (ref 3.5–5.2)
Sodium: 140 mmol/L (ref 134–144)
Total Protein: 7 g/dL (ref 6.0–8.5)

## 2019-01-07 LAB — LIPID PANEL
Chol/HDL Ratio: 3 ratio (ref 0.0–5.0)
Cholesterol, Total: 207 mg/dL — ABNORMAL HIGH (ref 100–199)
HDL: 69 mg/dL (ref >39)
LDL Calculated: 123 mg/dL — ABNORMAL HIGH (ref 0–99)
Triglycerides: 76 mg/dL (ref 0–149)
VLDL Cholesterol Cal: 15 mg/dL (ref 5–40)

## 2019-02-10 ENCOUNTER — Other Ambulatory Visit: Payer: Self-pay | Admitting: Family Medicine

## 2019-02-10 NOTE — Telephone Encounter (Signed)
Forwarding medication refill request to the clinical pool for review. 

## 2019-02-17 ENCOUNTER — Other Ambulatory Visit: Payer: Self-pay | Admitting: Family Medicine

## 2019-02-27 ENCOUNTER — Ambulatory Visit (INDEPENDENT_AMBULATORY_CARE_PROVIDER_SITE_OTHER): Payer: 59 | Admitting: Family Medicine

## 2019-02-27 ENCOUNTER — Other Ambulatory Visit: Payer: Self-pay

## 2019-02-27 DIAGNOSIS — Z23 Encounter for immunization: Secondary | ICD-10-CM

## 2019-05-10 ENCOUNTER — Other Ambulatory Visit: Payer: Self-pay | Admitting: Family Medicine

## 2019-05-10 NOTE — Telephone Encounter (Signed)
Requested Prescriptions  Pending Prescriptions Disp Refills  . rosuvastatin (CRESTOR) 5 MG tablet [Pharmacy Med Name: ROSUVASTATIN CALCIUM 5 MG TAB] 90 tablet 0    Sig: TAKE 1 TABLET BY MOUTH EVERY DAY     Cardiovascular:  Antilipid - Statins Failed - 05/10/2019  1:24 AM      Failed - Total Cholesterol in normal range and within 360 days    Cholesterol, Total  Date Value Ref Range Status  01/06/2019 207 (H) 100 - 199 mg/dL Final         Failed - LDL in normal range and within 360 days    LDL Calculated  Date Value Ref Range Status  01/06/2019 123 (H) 0 - 99 mg/dL Final         Passed - HDL in normal range and within 360 days    HDL  Date Value Ref Range Status  01/06/2019 69 >39 mg/dL Final         Passed - Triglycerides in normal range and within 360 days    Triglycerides  Date Value Ref Range Status  01/06/2019 76 0 - 149 mg/dL Final         Passed - Patient is not pregnant      Passed - Valid encounter within last 12 months    Recent Outpatient Visits          2 months ago Need for influenza vaccination   [facility] Lilia Argue, MD   4 months ago Pure hypercholesterolemia   [facility], Lilia Argue, MD   7 months ago Essential hypertension   [facility], Lilia Argue, MD   7 months ago Essential hypertension   [facility], Lilia Argue, MD   1 year ago Essential hypertension   [facility], Lilia Argue, MD      Future Appointments            In 2 months Rutherford Guys, MD Primary Care at [place]

## 2019-05-13 ENCOUNTER — Other Ambulatory Visit: Payer: Self-pay | Admitting: Family Medicine

## 2019-05-16 ENCOUNTER — Encounter: Payer: Self-pay | Admitting: Family Medicine

## 2019-07-10 ENCOUNTER — Encounter: Payer: Self-pay | Admitting: Family Medicine

## 2019-07-10 ENCOUNTER — Ambulatory Visit (INDEPENDENT_AMBULATORY_CARE_PROVIDER_SITE_OTHER): Payer: 59 | Admitting: Family Medicine

## 2019-07-10 ENCOUNTER — Other Ambulatory Visit: Payer: Self-pay

## 2019-07-10 VITALS — BP 122/70 | HR 83 | Temp 98.3°F | Ht 69.0 in | Wt 171.0 lb

## 2019-07-10 DIAGNOSIS — Z23 Encounter for immunization: Secondary | ICD-10-CM

## 2019-07-10 DIAGNOSIS — E7800 Pure hypercholesterolemia, unspecified: Secondary | ICD-10-CM

## 2019-07-10 DIAGNOSIS — Z125 Encounter for screening for malignant neoplasm of prostate: Secondary | ICD-10-CM | POA: Diagnosis not present

## 2019-07-10 DIAGNOSIS — E875 Hyperkalemia: Secondary | ICD-10-CM

## 2019-07-10 DIAGNOSIS — Z5181 Encounter for therapeutic drug level monitoring: Secondary | ICD-10-CM

## 2019-07-10 DIAGNOSIS — I10 Essential (primary) hypertension: Secondary | ICD-10-CM | POA: Diagnosis not present

## 2019-07-10 DIAGNOSIS — M159 Polyosteoarthritis, unspecified: Secondary | ICD-10-CM

## 2019-07-10 DIAGNOSIS — G894 Chronic pain syndrome: Secondary | ICD-10-CM

## 2019-07-10 MED ORDER — LISINOPRIL 20 MG PO TABS: 20.0000 mg | ORAL_TABLET | Freq: Every day | ORAL | 3 refills | Status: DC

## 2019-07-10 MED ORDER — ROSUVASTATIN CALCIUM 5 MG PO TABS: 5.0000 mg | ORAL_TABLET | Freq: Every day | ORAL | 3 refills | Status: DC

## 2019-07-10 MED ORDER — TRAMADOL HCL 50 MG PO TABS: 100.0000 mg | ORAL_TABLET | Freq: Two times a day (BID) | ORAL | 5 refills | Status: DC

## 2019-07-10 NOTE — Progress Notes (Signed)
2/15/20218:45 AM  Jay Davidson Oct 09, 1955, 64 y.o., male [id number]  Chief Complaint  Patient presents with  . Hypertension  . Hyperlipidemia    HPI:   Patient is a 63 y.o. male with past medical history significant for HLP, OA hips and left knee, HTNwho presents today for routine followup  Last OV aug 2020 - no changes  He has been doing really well since last OV Has been doing plant based diet Has lost weight  Has been working since  [employer] from Last 3 Encounters:  07/10/19 171 lb (77.6 kg)  01/06/19 184 lb (83.5 kg)  02/28/18 182 lb 3.2 oz (82.6 kg)  tramadol continues to work well for him pmp reviewed  Lab Results  Component Value Date   CREATININE 0.95 01/06/2019   BUN 11 01/06/2019   NA 140 01/06/2019   K 5.2 01/06/2019   CL 103 01/06/2019   CO2 22 01/06/2019   Lab Results  Component Value Date   CHOL 207 (H) 01/06/2019   HDL 69 01/06/2019   LDLCALC 123 (H) 01/06/2019   TRIG 76 01/06/2019   CHOLHDL 3.0 01/06/2019    Depression screen PHQ 2/9 07/10/2019 01/06/2019 10/05/2018  Decreased Interest 0 0 0  Down, Depressed, Hopeless 0 0 0  PHQ - 2 Score 0 0 0    Fall Risk  07/10/2019 01/06/2019 10/05/2018 02/28/2018 02/28/2018  Falls in the past year? 0 0 0 No No  Number falls in past yr: 0 0 - - -  Injury with Fall? 0 0 - - -  Comment - - - - -  Follow up - Falls evaluation completed - - -     Allergies  Allergen Reactions  . Pollen Extract Other (See Comments)    Unspecified reaction    Prior to Admission medications   Medication Sig Start Date End Date Taking? Authorizing Provider  aspirin 81 MG chewable tablet Chew 81 mg by mouth daily.   Yes [provider]  lisinopril (ZESTRIL) 20 MG tablet TAKE 1 TABLET BY MOUTH EVERY DAY 05/14/19  Yes Rutherford Guys, MD  Multiple Vitamins-Minerals (MULTIVITAMIN WITH MINERALS) tablet Take 1 tablet by mouth daily.   Yes [provider]  rosuvastatin (CRESTOR) 5 MG tablet TAKE 1 TABLET  BY MOUTH EVERY DAY 05/10/19  Yes Rutherford Guys, MD  traMADol (ULTRAM) 50 MG tablet Take 2 tablets (100 mg total) by mouth 2 (two) times daily. 01/06/19  Yes Rutherford Guys, MD    Past Medical History:  Diagnosis Date  . Asymptomatic cholelithiasis    chronic  . At risk for sleep apnea    STOP-BANG= 4  . History of diverticulitis of colon   . Incomplete RBBB   . Left ureteral calculus 11/2012, 01/2014   recurrent seen by urology Dr. Karsten Ro 01/2014  . OA (osteoarthritis)     Past Surgical History:  Procedure Laterality Date  . CYSTOSCOPY WITH URETEROSCOPY AND STENT PLACEMENT Left 02/02/2014   Procedure: CYSTOSCOPY WITH LEFT RETRGRADE, URETEROSCOPY AND STENT PLACEMENT;  Surgeon: Claybon Jabs, MD;  Location: [facility];  Service: Urology;  Laterality: Left;  . ELBOW SURGERY Right 2001  . HOLMIUM LASER APPLICATION Left 10/02/2583   Procedure: HOLMIUM LASER APPLICATION;  Surgeon: Claybon Jabs, MD;  Location: [facility];  Service: Urology;  Laterality: Left;  . KNEE ARTHROSCOPY Bilateral left 1984/   right 1996  . LEFT ELBOW RECONSTRUCTION COMMON EXTENSOR, LATERAL EPICONDYLE  01-24-2002  .  TOTAL HIP ARTHROPLASTY Right 10-07-2004  . TOTAL HIP REVISION Right 2012  . TRANSTHORACIC ECHOCARDIOGRAM  11-26-2009   grade II diastolic dysfunction/  ef 55-60%/  mild AR & MR/  mild aortic sclerosis without stenosis    Social History   Tobacco Use  . Smoking status: Former Smoker    Packs/day: 1.00    Years: 20.00    Pack years: 20.00    Types: Cigarettes    Quit date: 01/31/1989    Years since quitting: 30.4  . Smokeless tobacco: Former Neurosurgeon    Types: Snuff, Dorna Bloom    Quit date: 01/31/1989  Substance Use Topics  . Alcohol use: Yes    Alcohol/week: 12.0 standard drinks    Types: 12 Standard drinks or equivalent per week    Family History  Problem Relation Age of Onset  . Hypertension Mother   . Mental illness Father     Review of Systems    Constitutional: Negative for chills and fever.  Respiratory: Negative for cough and shortness of breath.   Cardiovascular: Negative for chest pain, palpitations and leg swelling.  Gastrointestinal: Negative for abdominal pain, nausea and vomiting.     OBJECTIVE:  Today's Vitals   07/10/19 0820  BP: 122/70  Pulse: 83  Temp: 98.3 F (36.8 C)  SpO2: 100%  Weight: 171 lb (77.6 kg)  Height: 5\' 9"  (1.753 m)   Body mass index is 25.25 kg/m.   Physical Exam Vitals and nursing note reviewed.  Constitutional:      Appearance: He is well-developed.  HENT:     Head: Normocephalic and atraumatic.  Eyes:     Conjunctiva/sclera: Conjunctivae normal.     Pupils: Pupils are equal, round, and reactive to light.  Cardiovascular:     Rate and Rhythm: Normal rate and regular rhythm.     Heart sounds: No murmur. No friction rub. No gallop.   Pulmonary:     Effort: Pulmonary effort is normal.     Breath sounds: Normal breath sounds. No wheezing or rales.  Musculoskeletal:     Cervical back: Neck supple.  Skin:    General: Skin is warm and dry.  Neurological:     Mental Status: He is alert and oriented to person, place, and time.     No results found for this or any previous visit (from the past 24 hour(s)).  No results found.   ASSESSMENT and PLAN  1. Pure hypercholesterolemia Checking labs today, medications will be adjusted as needed.  - Lipid Panel - Comprehensive metabolic panel  2. Essential hypertension Controlled. Continue current regime.  - Lipid Panel - Comprehensive metabolic panel  3. Prostate cancer screening - PSA  4. Need for vaccination Return in 2 months for nurse visit for shingrix #2 - Varicella-zoster vaccine IM  5. Osteoarthritis of multiple joints, unspecified osteoarthritis type 6. Chronic pain syndrome 7. Medication monitoring encounter Controlled. Continue current regime. pmp reviewed - ToxASSURE Select 13 (MW), Urine  Other orders -  lisinopril (ZESTRIL) 20 MG tablet; Take 1 tablet (20 mg total) by mouth daily. - rosuvastatin (CRESTOR) 5 MG tablet; Take 1 tablet (5 mg total) by mouth daily. - traMADol (ULTRAM) 50 MG tablet; Take 2 tablets (100 mg total) by mouth 2 (two) times daily.  Return in about 6 months (around 01/07/2020).    01/09/2020, MD Primary Care at [facility] [address] [place], [place] Ph.  [phone] Fax [phone]

## 2019-07-10 NOTE — Patient Instructions (Signed)
° ° ° °  If you have lab work done today you will be contacted with your lab results within the next 2 weeks.  If you have not heard from us then please contact us. The fastest way to get your results is to register for My Chart. ° ° °IF you received an x-ray today, you will receive an invoice from [facility]. Please contact [facility] at [phone] with questions or concerns regarding your invoice.  ° °IF you received labwork today, you will receive an invoice from [facility]. Please contact [facility] at [phone] with questions or concerns regarding your invoice.  ° °Our billing staff will not be able to assist you with questions regarding bills from these companies. ° °You will be contacted with the lab results as soon as they are available. The fastest way to get your results is to activate your My Chart account. Instructions are located on the last page of this paperwork. If you have not heard from us regarding the results in 2 weeks, please contact this office. °  ° ° ° °

## 2019-07-11 LAB — COMPREHENSIVE METABOLIC PANEL
ALT: 20 IU/L (ref 0–44)
AST: 26 IU/L (ref 0–40)
Albumin/Globulin Ratio: 1.9 (ref 1.2–2.2)
Albumin: 4.5 g/dL (ref 3.8–4.8)
Alkaline Phosphatase: 54 IU/L (ref 39–117)
BUN/Creatinine Ratio: 10 (ref 10–24)
BUN: 8 mg/dL (ref 8–27)
Bilirubin Total: 1 mg/dL (ref 0.0–1.2)
CO2: 26 mmol/L (ref 20–29)
Calcium: 9.3 mg/dL (ref 8.6–10.2)
Chloride: 105 mmol/L (ref 96–106)
Creatinine, Ser: 0.82 mg/dL (ref 0.76–1.27)
GFR calc Af Amer: 110 mL/min/{1.73_m2} (ref >59)
GFR calc non Af Amer: 95 mL/min/{1.73_m2} (ref >59)
Globulin, Total: 2.4 g/dL (ref 1.5–4.5)
Glucose: 96 mg/dL (ref 65–99)
Potassium: 5.5 mmol/L — ABNORMAL HIGH (ref 3.5–5.2)
Sodium: 142 mmol/L (ref 134–144)
Total Protein: 6.9 g/dL (ref 6.0–8.5)

## 2019-07-11 LAB — LIPID PANEL
Chol/HDL Ratio: 2.6 ratio (ref 0.0–5.0)
Cholesterol, Total: 168 mg/dL (ref 100–199)
HDL: 64 mg/dL (ref >39)
LDL Chol Calc (NIH): 90 mg/dL (ref 0–99)
Triglycerides: 72 mg/dL (ref 0–149)
VLDL Cholesterol Cal: 14 mg/dL (ref 5–40)

## 2019-07-11 LAB — PSA: Prostate Specific Ag, Serum: 1.2 ng/mL (ref 0.0–4.0)

## 2019-07-13 LAB — TOXASSURE SELECT 13 (MW), URINE

## 2019-08-01 NOTE — Addendum Note (Signed)
Addended by: Myles Lipps on: 08/01/2019 12:51 PM   Modules accepted: Orders

## 2019-08-13 ENCOUNTER — Other Ambulatory Visit: Payer: Self-pay | Admitting: Family Medicine

## 2019-08-13 NOTE — Telephone Encounter (Signed)
Change of pharmacy Requested Prescriptions  Pending Prescriptions Disp Refills  . lisinopril (ZESTRIL) 20 MG tablet [Pharmacy Med Name: LISINOPRIL 20 MG TABLET] 90 tablet 2    Sig: TAKE 1 TABLET BY MOUTH EVERY DAY     Cardiovascular:  ACE Inhibitors Failed - 08/13/2019  9:28 AM      Failed - K in normal range and within 180 days    Potassium  Date Value Ref Range Status  07/10/2019 5.5 (H) 3.5 - 5.2 mmol/L Final         Passed - Cr in normal range and within 180 days    Creat  Date Value Ref Range Status  03/12/2016 0.89 0.70 - 1.33 mg/dL Final    Comment:      For patients > or = 63 years of age: The upper reference limit for Creatinine is approximately 13% higher for people identified as African-American.      Creatinine, Ser  Date Value Ref Range Status  07/10/2019 0.82 0.76 - 1.27 mg/dL Final         Passed - Patient is not pregnant      Passed - Last BP in normal range    BP Readings from Last 1 Encounters:  07/10/19 122/70         Passed - Valid encounter within last 6 months    Recent Outpatient Visits          1 month ago Pure hypercholesterolemia   [facility], Meda Coffee, MD   5 months ago Need for influenza vaccination   [facility], Meda Coffee, MD   7 months ago Pure hypercholesterolemia   [facility], Meda Coffee, MD   10 months ago Essential hypertension   [facility], Meda Coffee, MD   10 months ago Essential hypertension   [facility], Meda Coffee, MD      Future Appointments            In 4 months Myles Lipps, MD Primary Care at [place], [facility]

## 2019-09-02 ENCOUNTER — Encounter: Payer: Self-pay | Admitting: Family Medicine

## 2019-09-04 ENCOUNTER — Ambulatory Visit: Payer: 59

## 2019-09-18 ENCOUNTER — Other Ambulatory Visit: Payer: Self-pay

## 2019-09-18 ENCOUNTER — Ambulatory Visit (INDEPENDENT_AMBULATORY_CARE_PROVIDER_SITE_OTHER): Payer: 59 | Admitting: Family Medicine

## 2019-09-18 DIAGNOSIS — Z23 Encounter for immunization: Secondary | ICD-10-CM

## 2019-09-18 NOTE — Patient Instructions (Signed)
° ° ° °  If you have lab work done today you will be contacted with your lab results within the next 2 weeks.  If you have not heard from us then please contact us. The fastest way to get your results is to register for My Chart. ° ° °IF you received an x-ray today, you will receive an invoice from [facility]. Please contact [facility] at [phone] with questions or concerns regarding your invoice.  ° °IF you received labwork today, you will receive an invoice from [facility]. Please contact [facility] at [phone] with questions or concerns regarding your invoice.  ° °Our billing staff will not be able to assist you with questions regarding bills from these companies. ° °You will be contacted with the lab results as soon as they are available. The fastest way to get your results is to activate your My Chart account. Instructions are located on the last page of this paperwork. If you have not heard from us regarding the results in 2 weeks, please contact this office. °  ° ° ° °

## 2019-09-18 NOTE — Progress Notes (Signed)
2nd Shingdrix Shot given in right arm. Tolerated well

## 2019-12-22 ENCOUNTER — Encounter: Payer: Self-pay | Admitting: Family Medicine

## 2019-12-22 NOTE — Telephone Encounter (Signed)
Called to schedule an appointment and LVM , but it look like patient has two upcoming appointments in August.

## 2019-12-26 ENCOUNTER — Encounter: Payer: Self-pay | Admitting: Family Medicine

## 2019-12-28 ENCOUNTER — Encounter: Payer: Self-pay | Admitting: Family Medicine

## 2019-12-28 ENCOUNTER — Ambulatory Visit (INDEPENDENT_AMBULATORY_CARE_PROVIDER_SITE_OTHER): Payer: 59 | Admitting: Family Medicine

## 2019-12-28 ENCOUNTER — Ambulatory Visit (INDEPENDENT_AMBULATORY_CARE_PROVIDER_SITE_OTHER): Payer: 59

## 2019-12-28 ENCOUNTER — Other Ambulatory Visit: Payer: Self-pay

## 2019-12-28 VITALS — BP 167/73 | HR 77 | Temp 98.1°F | Ht 69.0 in | Wt 156.6 lb

## 2019-12-28 DIAGNOSIS — G8929 Other chronic pain: Secondary | ICD-10-CM | POA: Diagnosis not present

## 2019-12-28 DIAGNOSIS — M25552 Pain in left hip: Secondary | ICD-10-CM

## 2019-12-28 MED ORDER — HYDROCODONE-ACETAMINOPHEN 5-325 MG PO TABS: 1.0000 | ORAL_TABLET | Freq: Four times a day (QID) | ORAL | 0 refills | Status: DC | PRN

## 2019-12-28 NOTE — Progress Notes (Signed)
8/5/20213:59 PM  Jay Davidson 1955/05/31, 64 y.o., male [id number]  Chief Complaint  Patient presents with  . Pain    pt is aking for letter for work due to the pain he has in the hip on the left side, has had 2 replacements on the right side.    HPI:   Patient is a 64 y.o. 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 0 0 0 0 No  Number falls in past yr: 0 0 0 - -  Injury with Fall? 0 0 0 - -  Comment - - - - -  Follow up - - Falls evaluation completed - -     Allergies  Allergen Reactions  . Pollen Extract Other (See Comments)    Unspecified reaction    Prior to Admission medications   Medication Sig Start Date End Date Taking? Authorizing Provider  aspirin 81 MG chewable tablet Chew 81 mg by mouth daily.   Yes [provider]  lisinopril (ZESTRIL) 20 MG tablet TAKE 1 TABLET BY MOUTH EVERY DAY 08/13/19  Yes 08/15/19, MD  Multiple Vitamins-Minerals (MULTIVITAMIN WITH MINERALS) tablet Take 1 tablet by mouth daily.   Yes [provider]  rosuvastatin (CRESTOR) 5 MG tablet Take 1 tablet (5 mg total) by mouth daily. 07/10/19  Yes 07/12/19, MD  traMADol (ULTRAM) 50 MG tablet Take 2  tablets (100 mg total) by mouth 2 (two) times daily. 07/10/19  Yes 07/12/19, MD    Past Medical History:  Diagnosis Date  . Asymptomatic cholelithiasis    chronic  . At risk for sleep apnea    STOP-BANG= 4  . History of diverticulitis of colon   . Incomplete RBBB   . Left ureteral calculus 11/2012, 01/2014   recurrent seen by urology Dr. 02/2014 01/2014  . OA (osteoarthritis)     Past Surgical History:  Procedure Laterality Date  . CYSTOSCOPY WITH URETEROSCOPY AND STENT PLACEMENT Left 02/02/2014   Procedure: CYSTOSCOPY WITH LEFT RETRGRADE, URETEROSCOPY AND STENT PLACEMENT;  Surgeon: 04/04/2014, MD;  Location: [facility];  Service: Urology;  Laterality: Left;  . ELBOW SURGERY Right 2001  . HOLMIUM LASER APPLICATION Left 02/02/2014   Procedure: HOLMIUM LASER APPLICATION;  Surgeon: 04/04/2014, MD;  Location: [facility];  Service: Urology;  Laterality: Left;  . KNEE ARTHROSCOPY Bilateral left 1984/   right 1996  . LEFT ELBOW RECONSTRUCTION COMMON EXTENSOR, LATERAL EPICONDYLE  01-24-2002  . TOTAL HIP ARTHROPLASTY Right 10-07-2004  . TOTAL HIP REVISION Right 2012  . TRANSTHORACIC ECHOCARDIOGRAM  11-26-2009   grade II diastolic dysfunction/  ef 55-60%/  mild AR & MR/  mild aortic sclerosis without stenosis    Social History   Tobacco Use  . Smoking status: Former Smoker    Packs/day: 1.00    Years: 20.00    Pack years: 20.00    Types: Cigarettes    Quit date: 01/31/1989    Years since quitting: 30.9  . Smokeless tobacco: Former Neurosurgeon    Types: Snuff, Dorna Bloom    Quit date: 01/31/1989  Substance Use Topics  . Alcohol use: Yes    Alcohol/week: 12.0 standard drinks    Types: 12 Standard drinks or equivalent per week    Family History  Problem Relation Age of Onset  . Hypertension Mother   . Mental illness Father     ROS Per hpi  OBJECTIVE:  Today's Vitals   12/28/19 1554  BP: (!) 167/73  Pulse: 77  Temp: 98.1 F (36.7 C)    SpO2: 97%  Weight: 156 lb 9.6 oz (71 kg)  Height: 5\' 9"  (1.753 m)   Body mass index is 23.13 kg/m.   Physical Exam Vitals and nursing note reviewed.  Constitutional:      Appearance: He is well-developed.  HENT:     Head: Normocephalic and atraumatic.  Eyes:     Conjunctiva/sclera: Conjunctivae normal.     Pupils: Pupils are equal, round, and reactive to light.  Pulmonary:     Effort: Pulmonary effort is normal.  Musculoskeletal:     Cervical back: Neck supple.     Right hip: Normal.     Left hip: No tenderness or crepitus. Decreased range of motion (pain along anterior groin and limited ROM with FADIR and FABER tests). Normal strength.  Skin:    General: Skin is warm and dry.  Neurological:     Mental Status: He is alert and oriented to person, place, and time.     Gait: Gait abnormal (favoring left leg, difficulty with standing from chair/exam table).       No results found for this or any previous visit (from the past 24 hour(s)).  DG Hip Unilat W OR W/O Pelvis 2-3 Views Left  Result Date: 12/29/2019 CLINICAL DATA:  Chronic LEFT hip pain EXAM: DG HIP (WITH OR WITHOUT PELVIS) 2-3V LEFT COMPARISON:  01/11/2015 FINDINGS: RIGHT total hip arthroplasty. Mild narrowing of the LEFT hip joint. Mild sclerosis of the LEFT acetabular roof. These findings are not changed from radiograph 2016. No acute findings. IMPRESSION: Mild arthropathy of the LEFT hip joint not changed from 2016. Electronically Signed   By: 2017 M.D.   On: 12/29/2019 08:28     ASSESSMENT and PLAN  1. Chronic left hip pain Xray stable. Referring to ortho for further eval and treatment. Discussed changing from tramadol to vicodin given uncontrolled pain, discussed new meds r/se/b. pmp reviewed. - DG Hip Unilat W OR W/O Pelvis 2-3 Views Left - Ambulatory referral to Orthopedic Surgery  Other orders - HYDROcodone-acetaminophen (NORCO/VICODIN) 5-325 MG tablet; Take 1 tablet by mouth every 6 (six)  hours as needed for moderate pain.  No follow-ups on file.    02/28/2020, MD Primary Care at [facility] [address] [place], [place] Ph.  [phone] Fax [phone]

## 2019-12-28 NOTE — Patient Instructions (Signed)
° ° ° °  If you have lab work done today you will be contacted with your lab results within the next 2 weeks.  If you have not heard from us then please contact us. The fastest way to get your results is to register for My Chart. ° ° °IF you received an x-ray today, you will receive an invoice from [facility]. Please contact [facility] at [phone] with questions or concerns regarding your invoice.  ° °IF you received labwork today, you will receive an invoice from [facility]. Please contact [facility] at [phone] with questions or concerns regarding your invoice.  ° °Our billing staff will not be able to assist you with questions regarding bills from these companies. ° °You will be contacted with the lab results as soon as they are available. The fastest way to get your results is to activate your My Chart account. Instructions are located on the last page of this paperwork. If you have not heard from us regarding the results in 2 weeks, please contact this office. °  ° ° ° °

## 2019-12-29 ENCOUNTER — Encounter: Payer: Self-pay | Admitting: Family Medicine

## 2020-01-08 ENCOUNTER — Encounter: Payer: Self-pay | Admitting: Family Medicine

## 2020-01-08 ENCOUNTER — Ambulatory Visit (INDEPENDENT_AMBULATORY_CARE_PROVIDER_SITE_OTHER): Payer: 59 | Admitting: Family Medicine

## 2020-01-08 ENCOUNTER — Other Ambulatory Visit: Payer: Self-pay

## 2020-01-08 VITALS — BP 140/75 | HR 67 | Temp 97.9°F | Ht 69.0 in | Wt 157.8 lb

## 2020-01-08 DIAGNOSIS — I10 Essential (primary) hypertension: Secondary | ICD-10-CM

## 2020-01-08 DIAGNOSIS — E7800 Pure hypercholesterolemia, unspecified: Secondary | ICD-10-CM | POA: Diagnosis not present

## 2020-01-08 DIAGNOSIS — Z8639 Personal history of other endocrine, nutritional and metabolic disease: Secondary | ICD-10-CM | POA: Diagnosis not present

## 2020-01-08 LAB — CMP14+EGFR
ALT: 21 IU/L (ref 0–44)
AST: 29 IU/L (ref 0–40)
Albumin/Globulin Ratio: 2.4 — ABNORMAL HIGH (ref 1.2–2.2)
Albumin: 4.5 g/dL (ref 3.8–4.8)
Alkaline Phosphatase: 62 IU/L (ref 48–121)
BUN/Creatinine Ratio: 14 (ref 10–24)
BUN: 12 mg/dL (ref 8–27)
Bilirubin Total: 1 mg/dL (ref 0.0–1.2)
CO2: 25 mmol/L (ref 20–29)
Calcium: 9 mg/dL (ref 8.6–10.2)
Chloride: 103 mmol/L (ref 96–106)
Creatinine, Ser: 0.83 mg/dL (ref 0.76–1.27)
GFR calc Af Amer: 109 mL/min/{1.73_m2} (ref >59)
GFR calc non Af Amer: 94 mL/min/{1.73_m2} (ref >59)
Globulin, Total: 1.9 g/dL (ref 1.5–4.5)
Glucose: 88 mg/dL (ref 65–99)
Potassium: 5.3 mmol/L — ABNORMAL HIGH (ref 3.5–5.2)
Sodium: 138 mmol/L (ref 134–144)
Total Protein: 6.4 g/dL (ref 6.0–8.5)

## 2020-01-08 LAB — LIPID PANEL
Chol/HDL Ratio: 2.4 ratio (ref 0.0–5.0)
Cholesterol, Total: 186 mg/dL (ref 100–199)
HDL: 78 mg/dL (ref >39)
LDL Chol Calc (NIH): 95 mg/dL (ref 0–99)
Triglycerides: 73 mg/dL (ref 0–149)
VLDL Cholesterol Cal: 13 mg/dL (ref 5–40)

## 2020-01-08 NOTE — Progress Notes (Signed)
 8/16/20218:16 AM  Jay Davidson 01/28/1956, 64 y.o., male [id number]  Chief Complaint  Patient presents with  . Hyperlipidemia    HPI:   Patient is a 64 y.o. male with past medical history significant for HLP, OA hips and left knee, HTN who presents today for routine followup  Last OV couple weeks ago - referred to ortho for left hip, transitioning from tramadol to vicodin,   Overall he is doing well sees ortho on the 23rd, has weaned off tramadol has been going well (down to 1/2 tab BID), taking vicodin prn (has only taken 3 tabs so far), has been off work, hip pain slowly better He is taking lisinopril and crestor as rx He has no acute concerns today  Lab Results  Component Value Date   CREATININE 0.82 07/10/2019   BUN 8 07/10/2019   NA 142 07/10/2019   K 5.5 (H) 07/10/2019   CL 105 07/10/2019   CO2 26 07/10/2019   Lab Results  Component Value Date   CHOL 168 07/10/2019   HDL 64 07/10/2019   LDLCALC 90 07/10/2019   TRIG 72 07/10/2019   CHOLHDL 2.6 07/10/2019    Depression screen PHQ 2/9 01/08/2020 07/10/2019 01/06/2019  Decreased Interest 0 0 0  Down, Depressed, Hopeless 0 0 0  PHQ - 2 Score 0 0 0    Fall Risk  12/28/2019 07/10/2019 01/06/2019 10/05/2018 02/28/2018  Falls in the past year? 0 0 0 0 No  Number falls in past yr: 0 0 0 - -  Injury with Fall? 0 0 0 - -  Comment - - - - -  Follow up - - Falls evaluation completed - -     Allergies  Allergen Reactions  . Pollen Extract Other (See Comments)    Unspecified reaction    Prior to Admission medications   Medication Sig Start Date End Date Taking? Authorizing Provider  aspirin 81 MG chewable tablet Chew 81 mg by mouth daily.   Yes [provider]  HYDROcodone-acetaminophen (NORCO/VICODIN) 5-325 MG tablet Take 1 tablet by mouth every 6 (six) hours as needed for moderate pain. 12/28/19  Yes Santiago, Irma M, MD  lisinopril (ZESTRIL) 20 MG tablet TAKE 1 TABLET BY MOUTH EVERY DAY 08/13/19  Yes  Santiago, Irma M, MD  Multiple Vitamins-Minerals (MULTIVITAMIN WITH MINERALS) tablet Take 1 tablet by mouth daily.   Yes [provider]  rosuvastatin (CRESTOR) 5 MG tablet Take 1 tablet (5 mg total) by mouth daily. 07/10/19  Yes Santiago, Irma M, MD  traMADol (ULTRAM) 50 MG tablet TAKE 2 TABLETS (100 MG TOTAL) BY MOUTH 2 (TWO) TIMES DAILY. 07/10/19  Yes [provider]    Past Medical History:  Diagnosis Date  . Asymptomatic cholelithiasis    chronic  . At risk for sleep apnea    STOP-BANG= 4  . History of diverticulitis of colon   . Incomplete RBBB   . Left ureteral calculus 11/2012, 01/2014   recurrent seen by urology Dr. Ottelin 01/2014  . OA (osteoarthritis)     Past Surgical History:  Procedure Laterality Date  . CYSTOSCOPY WITH URETEROSCOPY AND STENT PLACEMENT Left 02/02/2014   Procedure: CYSTOSCOPY WITH LEFT RETRGRADE, URETEROSCOPY AND STENT PLACEMENT;  Surgeon: Mark C Ottelin, MD;  Location: [facility];  Service: Urology;  Laterality: Left;  . ELBOW SURGERY Right 2001  . HOLMIUM LASER APPLICATION Left 02/02/2014   Procedure: HOLMIUM LASER APPLICATION;  Surgeon: Mark C Ottelin, MD;  Location: [facility];  Service: Urology;  Laterality: Left;  . KNEE ARTHROSCOPY Bilateral left 1984/   right 1996  . LEFT ELBOW RECONSTRUCTION COMMON EXTENSOR, LATERAL EPICONDYLE  01-24-2002  . TOTAL HIP ARTHROPLASTY Right 10-07-2004  . TOTAL HIP REVISION Right 2012  . TRANSTHORACIC ECHOCARDIOGRAM  11-26-2009   grade II diastolic dysfunction/  ef 55-60%/  mild AR & MR/  mild aortic sclerosis without stenosis    Social History   Tobacco Use  . Smoking status: Former Smoker    Packs/day: 1.00    Years: 20.00    Pack years: 20.00    Types: Cigarettes    Quit date: 01/31/1989    Years since quitting: 30.9  . Smokeless tobacco: Former Systems developer    Types: Snuff, Sarina Ser    Quit date: 01/31/1989  Substance Use Topics  . Alcohol use: Yes    Alcohol/week:  12.0 standard drinks    Types: 12 Standard drinks or equivalent per week    Family History  Problem Relation Age of Onset  . Hypertension Mother   . Mental illness Father     Review of Systems  Constitutional: Negative for chills and fever.  Respiratory: Negative for cough and shortness of breath.   Cardiovascular: Negative for chest pain, palpitations and leg swelling.  Gastrointestinal: Negative for abdominal pain, nausea and vomiting.   Per hpi  OBJECTIVE:  Today's Vitals   01/08/20 0806  BP: 140/75  Pulse: 67  Temp: 97.9 F (36.6 C)  SpO2: 100%  Weight: 157 lb 12.8 oz (71.6 kg)  Height: 5' 9" (1.753 m)   Body mass index is 23.3 kg/m.   Physical Exam Vitals and nursing note reviewed.  Constitutional:      Appearance: He is well-developed.  HENT:     Head: Normocephalic and atraumatic.  Eyes:     Extraocular Movements: Extraocular movements intact.     Conjunctiva/sclera: Conjunctivae normal.     Pupils: Pupils are equal, round, and reactive to light.  Cardiovascular:     Rate and Rhythm: Normal rate and regular rhythm.     Heart sounds: No murmur heard.  No friction rub. No gallop.   Pulmonary:     Effort: Pulmonary effort is normal.     Breath sounds: Normal breath sounds. No wheezing, rhonchi or rales.  Musculoskeletal:     Cervical back: Neck supple.     Right lower leg: No edema.     Left lower leg: No edema.  Skin:    General: Skin is warm and dry.  Neurological:     Mental Status: He is alert and oriented to person, place, and time.     No results found for this or any previous visit (from the past 24 hour(s)).  No results found.   ASSESSMENT and PLAN  1. Essential hypertension Controlled. Continue current regime as long of hyperkalemia not persistent. If so then change to amlodipine 45m. Patient does have BP cuff at home.  - CMP14+EGFR - Lipid panel  2. Pure hypercholesterolemia Checking labs today, medications will be adjusted as  needed.  - CMP14+EGFR - Lipid panel  3. H/O hypokalemia See #1  Other orders   Return in about 6 months (around 07/10/2020).    IRutherford Guys MD [facility] [place] [place] [id number].  [phone] [phone]

## 2020-01-08 NOTE — Patient Instructions (Addendum)
Bonnita Nasuti Referral Coordinator Aldean Baker [phone]   If you have lab work done today you will be contacted with your lab results within the next 2 weeks.  If you have not heard from [place] then please contact us. The fastest way to get your results is to register for My Chart.   IF you received an x-ray today, you will receive an invoice from [facility]. Please contact [facility] at [phone] with questions or concerns regarding your invoice.   IF you received labwork today, you will receive an invoice from [facility]. Please contact [facility] at [phone] with questions or concerns regarding your invoice.   Our billing staff will not be able to assist you with questions regarding bills from these companies.  You will be contacted with the lab results as soon as they are available. The fastest way to get your results is to activate your My Chart account. Instructions are located on the last page of this paperwork. If you have not heard from [place] regarding the results in 2 weeks, please contact this office.

## 2020-01-10 ENCOUNTER — Ambulatory Visit: Payer: 59 | Admitting: Orthopaedic Surgery

## 2020-01-15 ENCOUNTER — Ambulatory Visit (INDEPENDENT_AMBULATORY_CARE_PROVIDER_SITE_OTHER): Payer: 59 | Admitting: Orthopaedic Surgery

## 2020-01-15 VITALS — Ht 69.0 in | Wt 157.8 lb

## 2020-01-15 DIAGNOSIS — M25552 Pain in left hip: Secondary | ICD-10-CM | POA: Diagnosis not present

## 2020-01-15 MED ORDER — LIDOCAINE HCL 1 % IJ SOLN
3.0000 mL | INTRAMUSCULAR | Status: AC | PRN
  Administered 2020-01-15: 3 mL

## 2020-01-15 MED ORDER — METHYLPREDNISOLONE ACETATE 40 MG/ML IJ SUSP
40.0000 mg | INTRAMUSCULAR | Status: AC | PRN
  Administered 2020-01-15: 40 mg via INTRA_ARTICULAR

## 2020-01-15 NOTE — Progress Notes (Signed)
Office Visit Note   Patient: Jay Davidson           Date of Birth: Oct 15, 1955           MRN: [id number] Visit Date: 01/15/2020              Requested by: Myles Lipps, MD [address] [place],  [place] [address] PCP: Myles Lipps, MD   Assessment & Plan: Visit Diagnoses:  1. Pain in left hip     Plan: His clinical exam as well as signs and symptoms seems to be more consistent with trochanteric bursitis of the left hip.  I did place a steroid injection over this area and I recommend he try Voltaren gel over this area.  I showed him stretching exercises to try.  The risk and benefits of steroid injections were explained in detail.  He tolerated this well.  We will see him back in 4 weeks.  If his pain is not subsided I would recommend an MRI of the left hip.  All questions and concerns were answered and addressed.  Follow-Up Instructions: Return in about 4 weeks (around 02/12/2020).   Orders:  Orders Placed This Encounter  Procedures  . Large Joint Inj   No orders of the defined types were placed in this encounter.     Procedures: Large Joint Inj: L greater trochanter on 01/15/2020 11:11 AM Indications: pain and diagnostic evaluation Details: 22 G 1.5 in needle, lateral approach  Arthrogram: No  Medications: 3 mL lidocaine 1 %; 40 mg methylPREDNISolone acetate 40 MG/ML Outcome: tolerated well, no immediate complications Procedure, treatment alternatives, risks and benefits explained, specific risks discussed. Consent was given by the patient. Immediately prior to procedure a time out was called to verify the correct patient, procedure, equipment, support staff and site/side marked as required. Patient was prepped and draped in the usual sterile fashion.       Clinical Data: No additional findings.   Subjective: Chief Complaint  Patient presents with  . Left Hip - Pain  Patient is a very pleasant and active 64 year old gentleman who comes in for evaluation  treatment of left hip pain has been hurting for several months now.  He says is even getting worse with time.  He is very active.  He does have a history of a right total hip arthroplasty and a revision of that hip in 2016.  He denies any groin pain he points to the lateral aspect of his hip as a source of his pain.  He is now diabetic and not a smoker.  He has had no acute change in medical status.  HPI  Review of Systems There is currently no headache, chest pain, shortness of breath, fever, chills, nausea, vomiting  Objective: Vital Signs: Ht 5\' 9"  (1.753 m)   Wt 157 lb 12.8 oz (71.6 kg)   BMI 23.30 kg/m   Physical Exam He is alert and orient x3 and in no acute distress Ortho Exam When he does get up from a seated position he is quite stiff with favoring his left hip.  He has good internal and external rotation but the extremes of rotation gives him pain more on the lateral aspect of his hip but not in the groin. Specialty Comments:  No specialty comments available.  Imaging: No results found. X-rays independently reviewed on the canopy system of his left hip and compared to films in 2016 shows just some slight evidence of femoral acetabular impingement but is  not changed over the years.  The joint space is still well-maintained and I would call this more of mild arthritic changes.  PMFS History: Patient Active Problem List   Diagnosis Date Noted  . Osteoarthritis of multiple joints 01/06/2019  . Essential hypertension 09/01/2017  . Hip pain 09/11/2014  . Insomnia 09/11/2014  . Pure hypercholesterolemia 09/11/2014  . Chronic pain syndrome 09/11/2014  . 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 Osteoarthritis of hip 04/21/2011  . 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 RIGHT BUNDLE BRANCH BLOCK 11/01/2009   Past Medical History:  Diagnosis Date  . Asymptomatic cholelithiasis    chronic  . At risk for sleep apnea    STOP-BANG= 4  . History of  diverticulitis of colon   . Incomplete RBBB   . Left ureteral calculus 11/2012, 01/2014   recurrent seen by urology Dr. Vernie Ammons 01/2014  . OA (osteoarthritis)     Family History  Problem Relation Age of Onset  . Hypertension Mother   . Mental illness Father     Past Surgical History:  Procedure Laterality Date  . CYSTOSCOPY WITH URETEROSCOPY AND STENT PLACEMENT Left 02/02/2014   Procedure: CYSTOSCOPY WITH LEFT RETRGRADE, URETEROSCOPY AND STENT PLACEMENT;  Surgeon: Garnett Farm, MD;  Location: [facility];  Service: Urology;  Laterality: Left;  . ELBOW SURGERY Right 2001  . HOLMIUM LASER APPLICATION Left 02/02/2014   Procedure: HOLMIUM LASER APPLICATION;  Surgeon: Garnett Farm, MD;  Location: [facility];  Service: Urology;  Laterality: Left;  . KNEE ARTHROSCOPY Bilateral left 1984/   right 1996  . LEFT ELBOW RECONSTRUCTION COMMON EXTENSOR, LATERAL EPICONDYLE  01-24-2002  . TOTAL HIP ARTHROPLASTY Right 10-07-2004  . TOTAL HIP REVISION Right 2012  . TRANSTHORACIC ECHOCARDIOGRAM  11-26-2009   grade II diastolic dysfunction/  ef 55-60%/  mild AR & MR/  mild aortic sclerosis without stenosis   Social History   Occupational History  . 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 Smoking status: Former Smoker    Packs/day: 1.00    Years: 20.00    Pack years: 20.00    Types: Cigarettes    Quit date: 01/31/1989    Years since quitting: 30.9  . Smokeless tobacco: Former Neurosurgeon    Types: Snuff, Dorna Bloom    Quit date: 01/31/1989  Substance and Sexual Activity  . Alcohol use: Yes    Alcohol/week: 12.0 standard drinks    Types: 12 Standard drinks or equivalent per week  . Drug use: No  . Sexual activity: Yes

## 2020-01-30 ENCOUNTER — Other Ambulatory Visit: Payer: Self-pay | Admitting: Family Medicine

## 2020-01-30 DIAGNOSIS — E875 Hyperkalemia: Secondary | ICD-10-CM

## 2020-01-30 MED ORDER — AMLODIPINE BESYLATE 5 MG PO TABS: 5.0000 mg | ORAL_TABLET | Freq: Every day | ORAL | 3 refills | Status: DC

## 2020-02-12 ENCOUNTER — Ambulatory Visit: Payer: Managed Care, Other (non HMO) | Admitting: Orthopaedic Surgery

## 2020-05-08 ENCOUNTER — Other Ambulatory Visit: Payer: Self-pay

## 2020-05-08 ENCOUNTER — Encounter: Payer: Self-pay | Admitting: Family Medicine

## 2020-05-08 ENCOUNTER — Ambulatory Visit (INDEPENDENT_AMBULATORY_CARE_PROVIDER_SITE_OTHER): Payer: 59 | Admitting: Family Medicine

## 2020-05-08 VITALS — BP 150/70 | HR 64 | Temp 99.1°F | Ht 69.0 in | Wt 164.0 lb

## 2020-05-08 DIAGNOSIS — M25552 Pain in left hip: Secondary | ICD-10-CM

## 2020-05-08 DIAGNOSIS — E875 Hyperkalemia: Secondary | ICD-10-CM | POA: Diagnosis not present

## 2020-05-08 DIAGNOSIS — E7800 Pure hypercholesterolemia, unspecified: Secondary | ICD-10-CM

## 2020-05-08 DIAGNOSIS — I10 Essential (primary) hypertension: Secondary | ICD-10-CM

## 2020-05-08 DIAGNOSIS — G8929 Other chronic pain: Secondary | ICD-10-CM

## 2020-05-08 MED ORDER — TRAMADOL HCL 50 MG PO TABS: ORAL_TABLET | ORAL | 0 refills | Status: DC

## 2020-05-08 MED ORDER — AMLODIPINE BESYLATE 5 MG PO TABS: 5.0000 mg | ORAL_TABLET | Freq: Every day | ORAL | 1 refills | Status: DC

## 2020-05-08 NOTE — Patient Instructions (Addendum)
Restart amlodipine once per day. If new side effects or return of frequent urination, return to discuss further.   I recommend follow up with orthopaedist Dr. Magnus Ivan about other options for your hip. I will refill tramadol temporarily.    Managing Your Hypertension Hypertension is commonly called high blood pressure. This is when the force of your blood pressing against the walls of your arteries is too strong. Arteries are blood vessels that carry blood from your heart throughout your body. Hypertension forces the heart to work harder to pump blood, and may cause the arteries to become narrow or stiff. Having untreated or uncontrolled hypertension can cause heart attack, stroke, kidney disease, and other problems. What are blood pressure readings? A blood pressure reading consists of a higher number over a lower number. Ideally, your blood pressure should be below 120/80. The first ("top") number is called the systolic pressure. It is a measure of the pressure in your arteries as your heart beats. The second ("bottom") number is called the diastolic pressure. It is a measure of the pressure in your arteries as the heart relaxes. What does my blood pressure reading mean? Blood pressure is classified into four stages. Based on your blood pressure reading, your health care provider may use the following stages to determine what type of treatment you need, if any. Systolic pressure and diastolic pressure are measured in a unit called mm Hg. Normal  Systolic pressure: below 120.  Diastolic pressure: below 80. Elevated  Systolic pressure: 120-129.  Diastolic pressure: below 80. Hypertension stage 1  Systolic pressure: 130-139.  Diastolic pressure: 80-89. Hypertension stage 2  Systolic pressure: 140 or above.  Diastolic pressure: 90 or above. What health risks are associated with hypertension? Managing your hypertension is an important responsibility. Uncontrolled hypertension can lead  to:  A heart attack.  A stroke.  A weakened blood vessel (aneurysm).  Heart failure.  Kidney damage.  Eye damage.  Metabolic syndrome.  Memory and concentration problems. What changes can I make to manage my hypertension? Hypertension can be managed by making lifestyle changes and possibly by taking medicines. Your health care provider will help you make a plan to bring your blood pressure within a normal range. Eating and drinking   Eat a diet that is high in fiber and potassium, and low in salt (sodium), added sugar, and fat. An example eating plan is called the DASH (Dietary Approaches to Stop Hypertension) diet. To eat this way: ? Eat plenty of fresh fruits and vegetables. Try to fill half of your plate at each meal with fruits and vegetables. ? Eat whole grains, such as whole wheat pasta, brown rice, or whole grain bread. Fill about one quarter of your plate with whole grains. ? Eat low-fat diary products. ? Avoid fatty cuts of meat, processed or cured meats, and poultry with skin. Fill about one quarter of your plate with lean proteins such as fish, chicken without skin, beans, eggs, and tofu. ? Avoid premade and processed foods. These tend to be higher in sodium, added sugar, and fat.  Reduce your daily sodium intake. Most people with hypertension should eat less than 1,500 mg of sodium a day.  Limit alcohol intake to no more than 1 drink a day for nonpregnant women and 2 drinks a day for men. One drink equals 12 oz of beer, 5 oz of wine, or 1 oz of hard liquor. Lifestyle  Work with your health care provider to maintain a healthy body weight, or to  lose weight. Ask what an ideal weight is for you.  Get at least 30 minutes of exercise that causes your heart to beat faster (aerobic exercise) most days of the week. Activities may include walking, swimming, or biking.  Include exercise to strengthen your muscles (resistance exercise), such as weight lifting, as part of your  weekly exercise routine. Try to do these types of exercises for 30 minutes at least 3 days a week.  Do not use any products that contain nicotine or tobacco, such as cigarettes and e-cigarettes. If you need help quitting, ask your health care provider.  Control any long-term (chronic) conditions you have, such as high cholesterol or diabetes. Monitoring  Monitor your blood pressure at home as told by your health care provider. Your personal target blood pressure may vary depending on your medical conditions, your age, and other factors.  Have your blood pressure checked regularly, as often as told by your health care provider. Working with your health care provider  Review all the medicines you take with your health care provider because there may be side effects or interactions.  Talk with your health care provider about your diet, exercise habits, and other lifestyle factors that may be contributing to hypertension.  Visit your health care provider regularly. Your health care provider can help you create and adjust your plan for managing hypertension. Will I need medicine to control my blood pressure? Your health care provider may prescribe medicine if lifestyle changes are not enough to get your blood pressure under control, and if:  Your systolic blood pressure is 130 or higher.  Your diastolic blood pressure is 80 or higher. Take medicines only as told by your health care provider. Follow the directions carefully. Blood pressure medicines must be taken as prescribed. The medicine does not work as well when you skip doses. Skipping doses also puts you at risk for problems. Contact a health care provider if:  You think you are having a reaction to medicines you have taken.  You have repeated (recurrent) headaches.  You feel dizzy.  You have swelling in your ankles.  You have trouble with your vision. Get help right away if:  You develop a severe headache or confusion.  You  have unusual weakness or numbness, or you feel faint.  You have severe pain in your chest or abdomen.  You vomit repeatedly.  You have trouble breathing. Summary  Hypertension is when the force of blood pumping through your arteries is too strong. If this condition is not controlled, it may put you at risk for serious complications.  Your personal target blood pressure may vary depending on your medical conditions, your age, and other factors. For most people, a normal blood pressure is less than 120/80.  Hypertension is managed by lifestyle changes, medicines, or both. Lifestyle changes include weight loss, eating a healthy, low-sodium diet, exercising more, and limiting alcohol. This information is not intended to replace advice given to you by your health care provider. Make sure you discuss any questions you have with your health care provider. Document Revised: 09/02/2018 Document Reviewed: 04/08/2016 Elsevier Patient Education  [employer].    If you have lab work done today you will be contacted with your lab results within the next 2 weeks.  If you have not heard from [place] then please contact us. The fastest way to get your results is to register for My Chart.   IF you received an x-ray today, you will receive an invoice  from [facility]. Please contact [facility] at [phone] with questions or concerns regarding your invoice.   IF you received labwork today, you will receive an invoice from [facility] [place]. Please contact [facility] at [phone] with questions or concerns regarding your invoice.   Our billing staff will not be able to assist you with questions regarding bills from these companies.  You will be contacted with the lab results as soon as they are available. The fastest way to get your results is to activate your My Chart account. Instructions are located on the last page of this paperwork. If you have not heard from [place] regarding the results in  2 weeks, please contact this office.

## 2020-05-08 NOTE — Progress Notes (Signed)
Subjective:  Patient ID: Jay Davidson, male    DOB: 19-Sep-1955  Age: 64 y.o. MRN: [id number]  CC:  Chief Complaint  Patient presents with  . Transitions Of Care    Pt reports he feels good other than some pain in the anterior side of the L leg. Dr. Leretha Pol has treated this before and sent the pt to ortho for this. Other than this issue pt states he feel fine.    HPI Jay Davidson presents for  New patient to me, transfer of care, previous primary care provider Dr. Leretha Pol.  Last visit in August.  Hypertension: Previous ACE inhibitor lisinopril 20 mg daily changed to amlodipine due to hyperkalemia.  Amlodipine 5 mg daily currently. Last took amlodipine 2 weeks ago. Had some frequent urination with pills? Not now, no other side effects. Home readings: 120-130/70 on meds.  BP Readings from Last 3 Encounters:  05/08/20 (!) 150/70  01/08/20 140/75  12/28/19 (!) 167/73   Lab Results  Component Value Date   CREATININE 0.83 01/08/2020   Lab Results  Component Value Date   K 5.3 (H) 01/08/2020    Hyperlipidemia: Treated with Crestor 5 mg daily. No new myalgias.  Lab Results  Component Value Date   CHOL 186 01/08/2020   HDL 78 01/08/2020   LDLCALC 95 01/08/2020   TRIG 73 01/08/2020   CHOLHDL 2.4 01/08/2020   Lab Results  Component Value Date   ALT 21 01/08/2020   AST 29 01/08/2020   ALKPHOS 62 01/08/2020   BILITOT 1.0 01/08/2020   Left hip pain Chronic left hip pain discussed August 5 with Dr. Leretha Pol.  None hip osteoarthritis.  Progressively getting worse at that time.  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 Transition from Ultram to Vicodin at that time due to uncontrolled pain and was referred to orthopedic surgery.  Evaluated by orthopedics, Dr. Magnus Ivan on 01/15/2020. 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  Mild arthritic changes reported.   Steroid injection given at that time. Controlled substance database reviewed.  Hydrocodone #120 filled on August 5, previously tramadol 50 mg #120 on July 13.   No relief with injection. Has not followed up with ortho regarding pain. No relief with hydrocodone - tramadol worked better.     History Patient Active Problem List   Diagnosis Date Noted  . Osteoarthritis of multiple joints 01/06/2019  . Essential hypertension 09/01/2017  . Hip pain 09/11/2014  . Insomnia 09/11/2014  . Pure hypercholesterolemia 09/11/2014  . Chronic pain syndrome 09/11/2014  . 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 Osteoarthritis of hip 04/21/2011  . 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 RIGHT BUNDLE BRANCH BLOCK 11/01/2009   Past Medical History:  Diagnosis Date  . Asymptomatic cholelithiasis    chronic  . At risk for sleep apnea    STOP-BANG= 4  . History of diverticulitis of colon   . Incomplete RBBB   . Left ureteral calculus 11/2012, 01/2014   recurrent seen by urology Dr. Vernie Ammons 01/2014  . OA (osteoarthritis)    Past Surgical History:  Procedure Laterality Date  . CYSTOSCOPY WITH URETEROSCOPY AND STENT PLACEMENT Left 02/02/2014   Procedure: CYSTOSCOPY WITH LEFT RETRGRADE, URETEROSCOPY AND STENT PLACEMENT;  Surgeon: Garnett Farm, MD;  Location: [facility];  Service: Urology;  Laterality: Left;  . ELBOW SURGERY Right 2001  .  HOLMIUM LASER APPLICATION Left 02/02/2014   Procedure: HOLMIUM LASER APPLICATION;  Surgeon: Garnett Farm, MD;  Location: [facility];  Service: Urology;  Laterality: Left;  . KNEE ARTHROSCOPY Bilateral left 1984/   right 1996  . LEFT ELBOW RECONSTRUCTION COMMON EXTENSOR, LATERAL EPICONDYLE  01-24-2002  . TOTAL HIP ARTHROPLASTY Right 10-07-2004  . TOTAL HIP REVISION Right 2012  . TRANSTHORACIC ECHOCARDIOGRAM  11-26-2009   grade II diastolic dysfunction/  ef 55-60%/  mild AR & MR/  mild aortic sclerosis without  stenosis   Allergies  Allergen Reactions  . Pollen Extract Other (See Comments)    Unspecified reaction   Prior to Admission medications   Medication Sig Start Date End Date Taking? Authorizing Provider  amLODipine (NORVASC) 5 MG tablet Take 1 tablet (5 mg total) by mouth daily. 01/30/20  Yes Lezlie Lye, Meda Coffee, MD  aspirin 81 MG chewable tablet Chew 81 mg by mouth daily.   Yes [provider]  Multiple Vitamins-Minerals (MULTIVITAMIN WITH MINERALS) tablet Take 1 tablet by mouth daily.   Yes [provider]  rosuvastatin (CRESTOR) 5 MG tablet Take 1 tablet (5 mg total) by mouth daily. 07/10/19  Yes Lezlie Lye, Meda Coffee, MD  HYDROcodone-acetaminophen (NORCO/VICODIN) 5-325 MG tablet Take 1 tablet by mouth every 6 (six) hours as needed for moderate pain. 12/28/19   Lezlie Lye, Meda Coffee, MD  traMADol (ULTRAM) 50 MG tablet TAKE 2 TABLETS (100 MG TOTAL) BY MOUTH 2 (TWO) TIMES DAILY. 07/10/19   [provider]   Social History   Socioeconomic History  . Marital status: Divorced    Spouse name: Not on file  . Number of children: Not on file  . Years of education: Not on file  . Highest education level: Not on file  Occupational History  . 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 Smoking status: Former Smoker    Packs/day: 1.00    Years: 20.00    Pack years: 20.00    Types: Cigarettes    Quit date: 01/31/1989    Years since quitting: 31.2  . Smokeless tobacco: Former Neurosurgeon    Types: Snuff, Dorna Bloom    Quit date: 01/31/1989  Substance and Sexual Activity  . Alcohol use: Yes    Alcohol/week: 12.0 standard drinks    Types: 12 Standard drinks or equivalent per week  . Drug use: No  . Sexual activity: Yes  Other Topics Concern  . Not on file  Social History Narrative   Divorced.   Social Determinants of Health   Financial Resource Strain: Not on file  Food Insecurity: Not on file  Transportation Needs: Not on file  Physical Activity: Not on file   Stress: Not on file  Social Connections: Not on file  Intimate Partner Violence: Not on file    Review of Systems   Objective:   Vitals:   05/08/20 1422  BP: (!) 150/70  Pulse: 64  Temp: 99.1 F (37.3 C)  TempSrc: Temporal  SpO2: 98%  Weight: 164 lb (74.4 kg)  Height:  (1.753 m)     Physical Exam Vitals reviewed.  Constitutional:      Appearance: He is well-developed and well-nourished. He is not ill-appearing or diaphoretic.  HENT:     Head: Normocephalic and atraumatic.  Eyes:     Extraocular Movements: EOM normal.     Pupils: Pupils are equal, round, and reactive to light.  Neck:     Vascular: No carotid bruit or  JVD.  Cardiovascular:     Rate and Rhythm: Normal rate and regular rhythm.     Heart sounds: Normal heart sounds. No murmur heard.   Pulmonary:     Effort: Pulmonary effort is normal.     Breath sounds: Normal breath sounds. No rales.  Musculoskeletal:        General: No edema.     Comments: Negative slr  R hip: flexion 90 - discomfort.  Pain with FADIR>FABER.   Skin:    General: Skin is warm and dry.  Neurological:     Mental Status: He is alert and oriented to person, place, and time.  Psychiatric:        Mood and Affect: Mood and affect and mood normal.        Behavior: Behavior normal.     Assessment & Plan:  Jay Davidson is a 64 y.o. male . 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  Mild DJD thought to be the case at that time.  Minimal change in symptoms with injection, recommend he follow-up with orthopedist to describe on other treatments.  Refilled tramadol temporarily  Essential hypertension  -Home readings stable on amlodipine.  Less likely cause of frequent urination, once he restarts meds if the symptoms return, advised follow-up visit.  Hyperkalemia  -Repeat labs, continue amlodipine in place of ACE inhibitor.  Pure  hypercholesterolemia  -Tolerating statin, continue same  No orders of the defined types were placed in this encounter.  Patient Instructions   Restart amlodipine once per day. If new side effects or return of frequent urination, return to discuss further.   I recommend follow up with orthopaedist Dr. Magnus Ivan about other options for your hip. I will refill tramadol temporarily.    Managing Your Hypertension Hypertension is commonly called high blood pressure. This is when the force of your blood pressing against the walls of your arteries is too strong. Arteries are blood vessels that carry blood from your heart throughout your body. Hypertension forces the heart to work harder to pump blood, and may cause the arteries to become narrow or stiff. Having untreated or uncontrolled hypertension can cause heart attack, stroke, kidney disease, and other problems. What are blood pressure readings? A blood pressure reading consists of a higher number over a lower number. Ideally, your blood pressure should be below 120/80. The first ("top") number is called the systolic pressure. It is a measure of the pressure in your arteries as your heart beats. The second ("bottom") number is called the diastolic pressure. It is a measure of the pressure in your arteries as the heart relaxes. What does my blood pressure reading mean? Blood pressure is classified into four stages. Based on your blood pressure reading, your health care provider may use the following stages to determine what type of treatment you need, if any. Systolic pressure and diastolic pressure are measured in a unit called mm Hg. Normal  Systolic pressure: below 120.  Diastolic pressure: below 80. Elevated  Systolic pressure: 120-129.  Diastolic pressure: below 80. Hypertension stage 1  Systolic pressure: 130-139.  Diastolic pressure: 80-89. Hypertension stage 2  Systolic pressure: 140 or above.  Diastolic pressure: 90 or  above. What health risks are associated with hypertension? Managing your hypertension is an important responsibility. Uncontrolled hypertension can lead to:  A heart attack.  A stroke.  A weakened blood vessel (aneurysm).  Heart failure.  Kidney damage.  Eye damage.  Metabolic syndrome.  Memory and concentration problems. What changes can I make to manage my hypertension? Hypertension can be managed by making lifestyle changes and possibly by taking medicines. Your health care provider will help you make a plan to bring your blood pressure within a normal range. Eating and drinking   Eat a diet that is high in fiber and potassium, and low in salt (sodium), added sugar, and fat. An example eating plan is called the DASH (Dietary Approaches to Stop Hypertension) diet. To eat this way: ? Eat plenty of fresh fruits and vegetables. Try to fill half of your plate at each meal with fruits and vegetables. ? Eat whole grains, such as whole wheat pasta, brown rice, or whole grain bread. Fill about one quarter of your plate with whole grains. ? Eat low-fat diary products. ? Avoid fatty cuts of meat, processed or cured meats, and poultry with skin. Fill about one quarter of your plate with lean proteins such as fish, chicken without skin, beans, eggs, and tofu. ? Avoid premade and processed foods. These tend to be higher in sodium, added sugar, and fat.  Reduce your daily sodium intake. Most people with hypertension should eat less than 1,500 mg of sodium a day.  Limit alcohol intake to no more than 1 drink a day for nonpregnant women and 2 drinks a day for men. One drink equals 12 oz of beer, 5 oz of wine, or 1 oz of hard liquor. Lifestyle  Work with your health care provider to maintain a healthy body weight, or to lose weight. Ask what an ideal weight is for you.  Get at least 30 minutes of exercise that causes your heart to beat faster (aerobic exercise) most days of the week.  Activities may include walking, swimming, or biking.  Include exercise to strengthen your muscles (resistance exercise), such as weight lifting, as part of your weekly exercise routine. Try to do these types of exercises for 30 minutes at least 3 days a week.  Do not use any products that contain nicotine or tobacco, such as cigarettes and e-cigarettes. If you need help quitting, ask your health care provider.  Control any long-term (chronic) conditions you have, such as high cholesterol or diabetes. Monitoring  Monitor your blood pressure at home as told by your health care provider. Your personal target blood pressure may vary depending on your medical conditions, your age, and other factors.  Have your blood pressure checked regularly, as often as told by your health care provider. Working with your health care provider  Review all the medicines you take with your health care provider because there may be side effects or interactions.  Talk with your health care provider about your diet, exercise habits, and other lifestyle factors that may be contributing to hypertension.  Visit your health care provider regularly. Your health care provider can help you create and adjust your plan for managing hypertension. Will I need medicine to control my blood pressure? Your health care provider may prescribe medicine if lifestyle changes are not enough to get your blood pressure under control, and if:  Your systolic blood pressure is 130 or higher.  Your diastolic blood pressure is 80 or higher. Take medicines only as told by your health care provider. Follow the directions carefully. Blood pressure medicines must be taken as prescribed. The medicine does not work as well when you skip doses. Skipping doses also puts you at risk for problems. Contact a health care  provider if:  You think you are having a reaction to medicines you have taken.  You have repeated (recurrent) headaches.  You feel  dizzy.  You have swelling in your ankles.  You have trouble with your vision. Get help right away if:  You develop a severe headache or confusion.  You have unusual weakness or numbness, or you feel faint.  You have severe pain in your chest or abdomen.  You vomit repeatedly.  You have trouble breathing. Summary  Hypertension is when the force of blood pumping through your arteries is too strong. If this condition is not controlled, it may put you at risk for serious complications.  Your personal target blood pressure may vary depending on your medical conditions, your age, and other factors. For most people, a normal blood pressure is less than 120/80.  Hypertension is managed by lifestyle changes, medicines, or both. Lifestyle changes include weight loss, eating a healthy, low-sodium diet, exercising more, and limiting alcohol. This information is not intended to replace advice given to you by your health care provider. Make sure you discuss any questions you have with your health care provider. Document Revised: 09/02/2018 Document Reviewed: 04/08/2016 Elsevier Patient Education  [employer]2020 [employer]    If you have lab work done today you will be contacted with your lab results within the next 2 weeks.  If you have not heard from us then please contact us. The fastest way to get your results is to register for My Chart.   IF you received an x-ray today, you will receive an invoice from [facility]. Please contact [facility] at [phone] with questions or concerns regarding your invoice.   IF you received labwork today, you will receive an invoice from [facility]. Please contact [facility] at [phone] with questions or concerns regarding your invoice.   Our billing staff will not be able to assist you with questions regarding bills from these companies.  You will be contacted with the lab results as soon as they are available. The fastest way to get your  results is to activate your My Chart account. Instructions are located on the last page of this paperwork. If you have not heard from us regarding the results in 2 weeks, please contact this office.         Signed, Meredith StaggersJeffrey Clarke Amburn, MD [facility]

## 2020-05-09 ENCOUNTER — Encounter: Payer: Self-pay | Admitting: *Deleted

## 2020-05-09 ENCOUNTER — Telehealth: Payer: Self-pay | Admitting: *Deleted

## 2020-05-09 LAB — BASIC METABOLIC PANEL
BUN/Creatinine Ratio: 16 (ref 10–24)
BUN: 11 mg/dL (ref 8–27)
CO2: 28 mmol/L (ref 20–29)
Calcium: 9.4 mg/dL (ref 8.6–10.2)
Chloride: 102 mmol/L (ref 96–106)
Creatinine, Ser: 0.69 mg/dL — ABNORMAL LOW (ref 0.76–1.27)
GFR calc Af Amer: 117 mL/min/{1.73_m2} (ref >59)
GFR calc non Af Amer: 101 mL/min/{1.73_m2} (ref >59)
Glucose: 81 mg/dL (ref 65–99)
Potassium: 4.4 mmol/L (ref 3.5–5.2)
Sodium: 140 mmol/L (ref 134–144)

## 2020-05-09 NOTE — Telephone Encounter (Signed)
PA- tramadol Key: [id number]

## 2020-08-08 ENCOUNTER — Telehealth (INDEPENDENT_AMBULATORY_CARE_PROVIDER_SITE_OTHER): Payer: 59 | Admitting: Family Medicine

## 2020-08-08 ENCOUNTER — Other Ambulatory Visit: Payer: Self-pay

## 2020-08-08 VITALS — BP 135/70 | Ht 69.0 in

## 2020-08-08 DIAGNOSIS — I10 Essential (primary) hypertension: Secondary | ICD-10-CM

## 2020-08-08 DIAGNOSIS — E7800 Pure hypercholesterolemia, unspecified: Secondary | ICD-10-CM | POA: Diagnosis not present

## 2020-08-08 DIAGNOSIS — E875 Hyperkalemia: Secondary | ICD-10-CM | POA: Diagnosis not present

## 2020-08-08 MED ORDER — ROSUVASTATIN CALCIUM 5 MG PO TABS: 5.0000 mg | ORAL_TABLET | Freq: Every day | ORAL | 3 refills | Status: DC

## 2020-08-08 MED ORDER — AMLODIPINE BESYLATE 5 MG PO TABS: 5.0000 mg | ORAL_TABLET | Freq: Every day | ORAL | 1 refills | Status: DC

## 2020-08-08 NOTE — Progress Notes (Signed)
Virtual Visit via Telephone Note  I connected with Jay Davidson on 08/08/20 at 9:28 AM by telephone and verified that I am speaking with the correct person using two identifiers. Patient location:home My location: home   I discussed the limitations, risks, security and privacy concerns of performing an evaluation and management service by telephone and the availability of in person appointments. I also discussed with the patient that there may be a patient responsible charge related to this service. The patient expressed understanding and agreed to proceed, consent obtained  Chief complaint: Chief Complaint  Patient presents with  . Follow-up    3 month follow up, pt states he feels fine.  . Medication Refill    Pt states he needs a refill on Rosuvastatin   History of Present Illness:  Hypertension: Previously on ACE inhibitor lisinopril which was changed to amlodipine due to hyperkalemia.  Amlodipine 5 mg daily at his visit December 15.  Off medicine for a few weeks at that time.  Question of frequent urination on that medication.  Decided to restart same dose with RTC precautions Has been on amlodipine 5mg  qd - no return of frequent urination.  Home readings:130-150/70  Vegan, no added salt, cooks at home. Constitutional: Negative for fatigue and unexpected weight change.  Eyes: Negative for visual disturbance.  Respiratory: Negative for cough, chest tightness and shortness of breath.   Cardiovascular: Negative for chest pain, palpitations and leg swelling.  Gastrointestinal: Negative for abdominal pain and blood in stool.  Neurological: Negative for dizziness, light-headedness and headaches.     BP Readings from Last 3 Encounters:  08/08/20 135/70  05/08/20 (!) 150/70  01/08/20 140/75   Lab Results  Component Value Date   CREATININE 0.69 (L) 05/08/2020   Hyperkalemia 5.3 in August, improved on December 15 labs.  Hyperlipidemia: Crestor 5 mg daily. No new  myalgias/side effects. Rare ache in shoulder.  Lab Results  Component Value Date   CHOL 186 01/08/2020   HDL 78 01/08/2020   LDLCALC 95 01/08/2020   TRIG 73 01/08/2020   CHOLHDL 2.4 01/08/2020   Lab Results  Component Value Date   ALT 21 01/08/2020   AST 29 01/08/2020   ALKPHOS 62 01/08/2020   BILITOT 1.0 01/08/2020   Chronic left hip pain Discussed in December, short-term refill of tramadol provided.  #120, last listing on controlled substance database reviewed today.  Plan for orthopedist follow-up. Started it band stretches and voltaren cream - pain resolved. Doing well.   HM: had all 3 covid vaccines and flu shot with booster on 05/02/21.   Patient Active Problem List   Diagnosis Date Noted  . Osteoarthritis of multiple joints 01/06/2019  . Essential hypertension 09/01/2017  . Hip pain 09/11/2014  . Insomnia 09/11/2014  . Pure hypercholesterolemia 09/11/2014  . Chronic pain syndrome 09/11/2014  . 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 Osteoarthritis of hip 04/21/2011  . 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 RIGHT BUNDLE BRANCH BLOCK 11/01/2009   Past Medical History:  Diagnosis Date  . Asymptomatic cholelithiasis    chronic  . At risk for sleep apnea    STOP-BANG= 4  . History of diverticulitis of colon   . Incomplete RBBB   . Left ureteral calculus 11/2012, 01/2014   recurrent seen by urology Dr. 02/2014 01/2014  . OA (osteoarthritis)    Past Surgical History:  Procedure Laterality Date  . CYSTOSCOPY WITH URETEROSCOPY AND STENT PLACEMENT Left 02/02/2014  Procedure: CYSTOSCOPY WITH LEFT RETRGRADE, URETEROSCOPY AND STENT PLACEMENT;  Surgeon: Garnett Farm, MD;  Location: [facility];  Service: Urology;  Laterality: Left;  . ELBOW SURGERY Right 2001  . HOLMIUM LASER APPLICATION Left 02/02/2014   Procedure: HOLMIUM LASER APPLICATION;  Surgeon: Garnett Farm, MD;  Location: [facility];  Service: Urology;   Laterality: Left;  . KNEE ARTHROSCOPY Bilateral left 1984/   right 1996  . LEFT ELBOW RECONSTRUCTION COMMON EXTENSOR, LATERAL EPICONDYLE  01-24-2002  . TOTAL HIP ARTHROPLASTY Right 10-07-2004  . TOTAL HIP REVISION Right 2012  . TRANSTHORACIC ECHOCARDIOGRAM  11-26-2009   grade II diastolic dysfunction/  ef 55-60%/  mild AR & MR/  mild aortic sclerosis without stenosis   Allergies  Allergen Reactions  . Pollen Extract Other (See Comments)    Unspecified reaction   Prior to Admission medications   Medication Sig Start Date End Date Taking? Authorizing Provider  amLODipine (NORVASC) 5 MG tablet Take 1 tablet (5 mg total) by mouth daily. 05/08/20  Yes Shade Flood, MD  Multiple Vitamins-Minerals (MULTIVITAMIN WITH MINERALS) tablet Take 1 tablet by mouth daily.   Yes [provider]  rosuvastatin (CRESTOR) 5 MG tablet Take 1 tablet (5 mg total) by mouth daily. 07/10/19  Yes Lezlie Lye, Meda Coffee, MD  aspirin 81 MG chewable tablet Chew 81 mg by mouth daily.    [provider]  traMADol (ULTRAM) 50 MG tablet TAKE 2 TABLETS (100 MG TOTAL) BY MOUTH 2 (TWO) TIMES DAILY as needed. 05/08/20   Shade Flood, MD   Social History   Socioeconomic History  . Marital status: Divorced    Spouse name: Not on file  . Number of children: Not on file  . Years of education: Not on file  . Highest education level: Not on file  Occupational History  . 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 Smoking status: Former Smoker    Packs/day: 1.00    Years: 20.00    Pack years: 20.00    Types: Cigarettes    Quit date: 01/31/1989    Years since quitting: 31.5  . Smokeless tobacco: Former Neurosurgeon    Types: Snuff, Dorna Bloom    Quit date: 01/31/1989  Substance and Sexual Activity  . Alcohol use: Yes    Alcohol/week: 12.0 standard drinks    Types: 12 Standard drinks or equivalent per week  . Drug use: No  . Sexual activity: Yes  Other Topics Concern  . Not on file  Social History  Narrative   Divorced.   Social Determinants of Health   Financial Resource Strain: Not on file  Food Insecurity: Not on file  Transportation Needs: Not on file  Physical Activity: Not on file  Stress: Not on file  Social Connections: Not on file  Intimate Partner Violence: Not on file     Observations/Objective: Vitals:   08/08/20 0823  BP: 135/70  Height: 5\' 9"  (1.753 m)  No distress on phone, normal speech, appropriate responses.  All questions were answered with understanding plan expressed.  Mood euthymic.   Assessment and Plan: Essential hypertension - Plan: Comprehensive metabolic panel, amLODipine (NORVASC) 5 MG tablet  - borderline control.  Continue amlodipine 5 mg, but if persistent elevated home readings, advised to increase to 10 mg and call if he makes that change.  Handout given on hypertension management.  Plan for labs.  Hyperkalemia - Plan: Comprehensive metabolic panel  -Resolved last visit off ACE inhibitor,  repeat with upcoming lab work.  Pure hypercholesterolemia - Plan: Lipid panel, rosuvastatin (CRESTOR) 5 MG tablet  -Tolerating statin, continue same.  Check labs previous hip pain has improved.  Episodic shoulder discomfort but not persistent.  Option of temporary trial off statin if he does have myalgias/arthralgias to see if related.  RTC precautions.  61-month follow-up.  Follow Up Instructions: 6 months, physical   I discussed the assessment and treatment plan with the patient. The patient was provided an opportunity to ask questions and all were answered. The patient agreed with the plan and demonstrated an understanding of the instructions.   The patient was advised to call back or seek an in-person evaluation if the symptoms worsen or if the condition fails to improve as anticipated.  I provided 11 minutes of non-face-to-face time during this encounter.  Signed,   [name], MD [facility].   08/08/20

## 2020-08-08 NOTE — Patient Instructions (Addendum)
Good talking to you today.  No medication changes at this time.  If blood pressures are running over 130/80 at home, you have the option of trying 10 mg of amlodipine or 2 of the 5 mg each day.  Let me know if you end up making that change and I will adjust the medication at the pharmacy.  See information below for locations to have labs drawn.  Those should be fasting labs and try to have those done within the next 1 week.    Follow-up in 6 months for physical but please let me know if there are questions in the meantime.   Here are a few lab drawing stations for you to have your labwork performed:  [facility] [address] [place], [place] [address]  [facility] [address] [place], [place] [address]   Managing Your Hypertension Hypertension, also called high blood pressure, is when the force of the blood pressing against the walls of the arteries is too strong. Arteries are blood vessels that carry blood from your heart throughout your body. Hypertension forces the heart to work harder to pump blood and may cause the arteries to become narrow or stiff. Understanding blood pressure readings Your personal target blood pressure may vary depending on your medical conditions, your age, and other factors. A blood pressure reading includes a higher number over a lower number. Ideally, your blood pressure should be below 120/80. You should know that:  The first, or top, number is called the systolic pressure. It is a measure of the pressure in your arteries as your heart beats.  The second, or bottom number, is called the diastolic pressure. It is a measure of the pressure in your arteries as the heart relaxes. Blood pressure is classified into four stages. Based on your blood pressure reading, your health care provider may use the following stages to determine what type of treatment you need, if any. Systolic pressure and diastolic pressure are measured in a unit called  mmHg. Normal  Systolic pressure: below 120.  Diastolic pressure: below 80. Elevated  Systolic pressure: 120-129.  Diastolic pressure: below 80. Hypertension stage 1  Systolic pressure: 130-139.  Diastolic pressure: 80-89. Hypertension stage 2  Systolic pressure: 140 or above.  Diastolic pressure: 90 or above. How can this condition affect me? Managing your hypertension is an important responsibility. Over time, hypertension can damage the arteries and decrease blood flow to important parts of the body, including the brain, heart, and kidneys. Having untreated or uncontrolled hypertension can lead to:  A heart attack.  A stroke.  A weakened blood vessel (aneurysm).  Heart failure.  Kidney damage.  Eye damage.  Metabolic syndrome.  Memory and concentration problems.  Vascular dementia. What actions can I take to manage this condition? Hypertension can be managed by making lifestyle changes and possibly by taking medicines. Your health care provider will help you make a plan to bring your blood pressure within a normal range. Nutrition  Eat a diet that is high in fiber and potassium, and low in salt (sodium), added sugar, and fat. An example eating plan is called the Dietary Approaches to Stop Hypertension (DASH) diet. To eat this way: ? Eat plenty of fresh fruits and vegetables. Try to fill one-half of your plate at each meal with fruits and vegetables. ? Eat whole grains, such as whole-wheat pasta, brown rice, or whole-grain bread. Fill about one-fourth of your plate with whole grains. ? Eat low-fat dairy products. ? Avoid  fatty cuts of meat, processed or cured meats, and poultry with skin. Fill about one-fourth of your plate with lean proteins such as fish, chicken without skin, beans, eggs, and tofu. ? Avoid pre-made and processed foods. These tend to be higher in sodium, added sugar, and fat.  Reduce your daily sodium intake. Most people with hypertension should  eat less than 1,500 mg of sodium a day.   Lifestyle  Work with your health care provider to maintain a healthy body weight or to lose weight. Ask what an ideal weight is for you.  Get at least 30 minutes of exercise that causes your heart to beat faster (aerobic exercise) most days of the week. Activities may include walking, swimming, or biking.  Include exercise to strengthen your muscles (resistance exercise), such as weight lifting, as part of your weekly exercise routine. Try to do these types of exercises for 30 minutes at least 3 days a week.  Do not use any products that contain nicotine or tobacco, such as cigarettes, e-cigarettes, and chewing tobacco. If you need help quitting, ask your health care provider.  Control any long-term (chronic) conditions you have, such as high cholesterol or diabetes.  Identify your sources of stress and find ways to manage stress. This may include meditation, deep breathing, or making time for fun activities.   Alcohol use  Do not drink alcohol if: ? Your health care provider tells you not to drink. ? You are pregnant, may be pregnant, or are planning to become pregnant.  If you drink alcohol: ? Limit how much you use to:  0-1 drink a day for women.  0-2 drinks a day for men. ? Be aware of how much alcohol is in your drink. In the [place] one drink equals one 12 oz bottle of beer (355 mL), one 5 oz glass of wine (148 mL), or one 1 oz glass of hard liquor (44 mL). Medicines Your health care provider may prescribe medicine if lifestyle changes are not enough to get your blood pressure under control and if:  Your systolic blood pressure is 130 or higher.  Your diastolic blood pressure is 80 or higher. Take medicines only as told by your health care provider. Follow the directions carefully. Blood pressure medicines must be taken as told by your health care provider. The medicine does not work as well when you skip doses. Skipping doses also puts  you at risk for problems. Monitoring Before you monitor your blood pressure:  Do not smoke, drink caffeinated beverages, or exercise within 30 minutes before taking a measurement.  Use the bathroom and empty your bladder (urinate).  Sit quietly for at least 5 minutes before taking measurements. Monitor your blood pressure at home as told by your health care provider. To do this:  Sit with your back straight and supported.  Place your feet flat on the floor. Do not cross your legs.  Support your arm on a flat surface, such as a table. Make sure your upper arm is at heart level.  Each time you measure, take two or three readings one minute apart and record the results. You may also need to have your blood pressure checked regularly by your health care provider.   General information  Talk with your health care provider about your diet, exercise habits, and other lifestyle factors that may be contributing to hypertension.  Review all the medicines you take with your health care provider because there may be side effects or interactions.  Keep all visits as told by your health care provider. Your health care provider can help you create and adjust your plan for managing your high blood pressure. Where to find more information  National Heart, Lung, and Blood Institute: PopSteam.is  American Heart Association: www.heart.org Contact a health care provider if:  You think you are having a reaction to medicines you have taken.  You have repeated (recurrent) headaches.  You feel dizzy.  You have swelling in your ankles.  You have trouble with your vision. Get help right away if:  You develop a severe headache or confusion.  You have unusual weakness or numbness, or you feel faint.  You have severe pain in your chest or abdomen.  You vomit repeatedly.  You have trouble breathing. These symptoms may represent a serious problem that is an emergency. Do not wait to see if  the symptoms will go away. Get medical help right away. Call your local emergency services (911 in the [place] Do not drive yourself to the hospital. Summary  Hypertension is when the force of blood pumping through your arteries is too strong. If this condition is not controlled, it may put you at risk for serious complications.  Your personal target blood pressure may vary depending on your medical conditions, your age, and other factors. For most people, a normal blood pressure is less than 120/80.  Hypertension is managed by lifestyle changes, medicines, or both.  Lifestyle changes to help manage hypertension include losing weight, eating a healthy, low-sodium diet, exercising more, stopping smoking, and limiting alcohol. This information is not intended to replace advice given to you by your health care provider. Make sure you discuss any questions you have with your health care provider. Document Revised: 06/16/2019 Document Reviewed: 04/11/2019 Elsevier Patient Education  2021 [employer].   If you have lab work done today you will be contacted with your lab results within the next 2 weeks.  If you have not heard from [place] then please contact us. The fastest way to get your results is to register for My Chart.   IF you received an x-ray today, you will receive an invoice from [facility]. Please contact [facility] at [phone] with questions or concerns regarding your invoice.   IF you received labwork today, you will receive an invoice from [facility]. Please contact [facility] at [phone] with questions or concerns regarding your invoice.   Our billing staff will not be able to assist you with questions regarding bills from these companies.  You will be contacted with the lab results as soon as they are available. The fastest way to get your results is to activate your My Chart account. Instructions are located on the last page of this paperwork. If you have not  heard from [place] regarding the results in 2 weeks, please contact this office.

## 2020-08-15 LAB — COMPREHENSIVE METABOLIC PANEL
ALT: 25 IU/L (ref 0–44)
AST: 36 IU/L (ref 0–40)
Albumin/Globulin Ratio: 2.1 (ref 1.2–2.2)
Albumin: 4.7 g/dL (ref 3.8–4.8)
Alkaline Phosphatase: 72 IU/L (ref 44–121)
BUN/Creatinine Ratio: 13 (ref 10–24)
BUN: 10 mg/dL (ref 8–27)
Bilirubin Total: 0.9 mg/dL (ref 0.0–1.2)
CO2: 24 mmol/L (ref 20–29)
Calcium: 9.6 mg/dL (ref 8.6–10.2)
Chloride: 104 mmol/L (ref 96–106)
Creatinine, Ser: 0.76 mg/dL (ref 0.76–1.27)
Globulin, Total: 2.2 g/dL (ref 1.5–4.5)
Glucose: 99 mg/dL (ref 65–99)
Potassium: 5.4 mmol/L — ABNORMAL HIGH (ref 3.5–5.2)
Sodium: 143 mmol/L (ref 134–144)
Total Protein: 6.9 g/dL (ref 6.0–8.5)
eGFR: 101 mL/min/{1.73_m2} (ref >59)

## 2020-08-15 LAB — LIPID PANEL
Chol/HDL Ratio: 1.8 ratio (ref 0.0–5.0)
Cholesterol, Total: 191 mg/dL (ref 100–199)
HDL: 104 mg/dL (ref >39)
LDL Chol Calc (NIH): 77 mg/dL (ref 0–99)
Triglycerides: 54 mg/dL (ref 0–149)
VLDL Cholesterol Cal: 10 mg/dL (ref 5–40)

## 2020-08-22 ENCOUNTER — Encounter: Payer: Self-pay | Admitting: Family Medicine

## 2020-08-22 NOTE — Telephone Encounter (Signed)
Pt notes high potassium diet and is considering dietary changes

## 2021-02-13 ENCOUNTER — Encounter: Payer: Self-pay | Admitting: Family Medicine

## 2021-02-13 ENCOUNTER — Other Ambulatory Visit: Payer: Self-pay

## 2021-02-13 ENCOUNTER — Ambulatory Visit (INDEPENDENT_AMBULATORY_CARE_PROVIDER_SITE_OTHER): Payer: Managed Care, Other (non HMO) | Admitting: Family Medicine

## 2021-02-13 VITALS — BP 126/72 | HR 71 | Temp 98.3°F | Resp 16 | Ht 69.0 in | Wt 167.6 lb

## 2021-02-13 DIAGNOSIS — I10 Essential (primary) hypertension: Secondary | ICD-10-CM

## 2021-02-13 DIAGNOSIS — Z23 Encounter for immunization: Secondary | ICD-10-CM | POA: Diagnosis not present

## 2021-02-13 DIAGNOSIS — Z131 Encounter for screening for diabetes mellitus: Secondary | ICD-10-CM

## 2021-02-13 DIAGNOSIS — E7800 Pure hypercholesterolemia, unspecified: Secondary | ICD-10-CM | POA: Diagnosis not present

## 2021-02-13 DIAGNOSIS — Z0000 Encounter for general adult medical examination without abnormal findings: Secondary | ICD-10-CM

## 2021-02-13 DIAGNOSIS — Z130 Encounter for screening for diseases of the blood and blood-forming organs and certain disorders involving the immune mechanism: Secondary | ICD-10-CM

## 2021-02-13 DIAGNOSIS — Z125 Encounter for screening for malignant neoplasm of prostate: Secondary | ICD-10-CM

## 2021-02-13 LAB — CBC
HCT: 43.1 % (ref 39.0–52.0)
Hemoglobin: 14.1 g/dL (ref 13.0–17.0)
MCHC: 32.7 g/dL (ref 30.0–36.0)
MCV: 99.6 fl (ref 78.0–100.0)
Platelets: 184 10*3/uL (ref 150.0–400.0)
RBC: 4.33 Mil/uL (ref 4.22–5.81)
RDW: 13.6 % (ref 11.5–15.5)
WBC: 4.5 10*3/uL (ref 4.0–10.5)

## 2021-02-13 LAB — COMPREHENSIVE METABOLIC PANEL
ALT: 31 U/L (ref 0–53)
AST: 42 U/L — ABNORMAL HIGH (ref 0–37)
Albumin: 4.4 g/dL (ref 3.5–5.2)
Alkaline Phosphatase: 54 U/L (ref 39–117)
BUN: 9 mg/dL (ref 6–23)
CO2: 27 mEq/L (ref 19–32)
Calcium: 9.3 mg/dL (ref 8.4–10.5)
Chloride: 105 mEq/L (ref 96–112)
Creatinine, Ser: 0.75 mg/dL (ref 0.40–1.50)
GFR: 95.77 mL/min (ref >60.00)
Glucose, Bld: 86 mg/dL (ref 70–99)
Potassium: 4.6 mEq/L (ref 3.5–5.1)
Sodium: 140 mEq/L (ref 135–145)
Total Bilirubin: 2 mg/dL — ABNORMAL HIGH (ref 0.2–1.2)
Total Protein: 6.7 g/dL (ref 6.0–8.3)

## 2021-02-13 LAB — LIPID PANEL
Cholesterol: 189 mg/dL (ref 0–200)
HDL: 95.7 mg/dL (ref >39.00)
LDL Cholesterol: 82 mg/dL (ref 0–99)
NonHDL: 92.94
Total CHOL/HDL Ratio: 2
Triglycerides: 55 mg/dL (ref 0.0–149.0)
VLDL: 11 mg/dL (ref 0.0–40.0)

## 2021-02-13 LAB — HEMOGLOBIN A1C: Hgb A1c MFr Bld: 5.1 % (ref 4.6–6.5)

## 2021-02-13 MED ORDER — AMLODIPINE BESYLATE 5 MG PO TABS: 5.0000 mg | ORAL_TABLET | Freq: Every day | ORAL | 1 refills | Status: DC

## 2021-02-13 MED ORDER — ROSUVASTATIN CALCIUM 5 MG PO TABS: 5.0000 mg | ORAL_TABLET | Freq: Every day | ORAL | 3 refills | Status: DC

## 2021-02-13 NOTE — Patient Instructions (Addendum)
I do recommend seeing a dentist. If you need a dentist, here is an option: [facility]  Address: [address] [place], [place], [place] [address]  [email or web address]  Phone: [phone]  Keep up the good work with exercise.  Glad to hear that you are feeling well.  No medication changes for now.  Follow-up in 6 months to review your cholesterol and blood pressure, but let me know if there are questions sooner.  Take care.  Preventive Care 64-84 Years Old, Male Preventive care refers to lifestyle choices and visits with your health care provider that can promote health and wellness. This includes: A yearly physical exam. This is also called an annual wellness visit. Regular dental and eye exams. Immunizations. Screening for certain conditions. Healthy lifestyle choices, such as: Eating a healthy diet. Getting regular exercise. Not using drugs or products that contain nicotine and tobacco. Limiting alcohol use. What can I expect for my preventive care visit? Physical exam Your health care provider will check your: Height and weight. These may be used to calculate your BMI (body mass index). BMI is a measurement that tells if you are at a healthy weight. Heart rate and blood pressure. Body temperature. Skin for abnormal spots. Counseling Your health care provider may ask you questions about your: Past medical problems. Family's medical history. Alcohol, tobacco, and drug use. Emotional well-being. Home life and relationship well-being. Sexual activity. Diet, exercise, and sleep habits. Work and work Astronomer. Access to firearms. What immunizations do I need? Vaccines are usually given at various ages, according to a schedule. Your health care provider will recommend vaccines for you based on your age, medical history, and lifestyle or other factors, such as travel or where you work. What tests do I need? Blood tests Lipid and cholesterol levels. These may be checked  every 5 years, or more often if you are over 34 years old. Hepatitis C test. Hepatitis B test. Screening Lung cancer screening. You may have this screening every year starting at age 44 if you have a 30-pack-year history of smoking and currently smoke or have quit within the past 15 years. Prostate cancer screening. Recommendations will vary depending on your family history and other risks. Genital exam to check for testicular cancer or hernias. Colorectal cancer screening. All adults should have this screening starting at age 11 and continuing until age 25. Your health care provider may recommend screening at age 34 if you are at increased risk. You will have tests every 1-10 years, depending on your results and the type of screening test. Diabetes screening. This is done by checking your blood sugar (glucose) after you have not eaten for a while (fasting). You may have this done every 1-3 years. STD (sexually transmitted disease) testing, if you are at risk. Follow these instructions at home: Eating and drinking  Eat a diet that includes fresh fruits and vegetables, whole grains, lean protein, and low-fat dairy products. Take vitamin and mineral supplements as recommended by your health care provider. Do not drink alcohol if your health care provider tells you not to drink. If you drink alcohol: Limit how much you have to 0-2 drinks a day. Be aware of how much alcohol is in your drink. In the [place] one drink equals one 12 oz bottle of beer (355 mL), one 5 oz glass of wine (148 mL), or one 1 oz glass of hard liquor (44 mL). Lifestyle Take daily care of your teeth and gums. Brush your teeth every morning and  night with fluoride toothpaste. Floss one time each day. Stay active. Exercise for at least 30 minutes 5 or more days each week. Do not use any products that contain nicotine or tobacco, such as cigarettes, e-cigarettes, and chewing tobacco. If you need help quitting, ask your health  care provider. Do not use drugs. If you are sexually active, practice safe sex. Use a condom or other form of protection to prevent STIs (sexually transmitted infections). If told by your health care provider, take low-dose aspirin daily starting at age 35. Find healthy ways to cope with stress, such as: Meditation, yoga, or listening to music. Journaling. Talking to a trusted person. Spending time with friends and family. Safety Always wear your seat belt while driving or riding in a vehicle. Do not drive: If you have been drinking alcohol. Do not ride with someone who has been drinking. When you are tired or distracted. While texting. Wear a helmet and other protective equipment during sports activities. If you have firearms in your house, make sure you follow all gun safety procedures. What's next? Go to your health care provider once a year for an annual wellness visit. Ask your health care provider how often you should have your eyes and teeth checked. Stay up to date on all vaccines. This information is not intended to replace advice given to you by your health care provider. Make sure you discuss any questions you have with your health care provider. Document Revised: 07/19/2020 Document Reviewed: 05/05/2018 Elsevier Patient Education  2022 [employer].

## 2021-02-13 NOTE — Progress Notes (Signed)
Subjective:  Patient ID: Jay Davidson, male    DOB: 1956/02/12  Age: 65 y.o. MRN: [id number]  CC:  Chief Complaint  Patient presents with   Annual Exam    Pt does not have any questions today no other discussions, feels well.     HPI Jay Davidson presents for   Annual physical exam. Feeling well - better than most of his life.   Hypertension: Amlodipine 5 mg daily.  No new side effects.  Home readings: 125-130/70. BP Readings from Last 3 Encounters:  02/13/21 126/72  08/08/20 135/70  05/08/20 (!) 150/70   Lab Results  Component Value Date   CREATININE 0.76 08/14/2020   Hyperlipidemia: Crestor 5 mg daily. No new myalgias, side effects.  Lab Results  Component Value Date   CHOL 191 08/14/2020   HDL 104 08/14/2020   LDLCALC 77 08/14/2020   TRIG 54 08/14/2020   CHOLHDL 1.8 08/14/2020   Lab Results  Component Value Date   ALT 25 08/14/2020   AST 36 08/14/2020   ALKPHOS 72 08/14/2020   BILITOT 0.9 08/14/2020   Cancer screening  Colonoscopy 05/25/2012, repeat 10 years. No FH of colon CA.  Only family history of prostate cancer maternal GF.  The natural history of prostate cancer and ongoing controversy regarding screening and potential treatment outcomes of prostate cancer has been discussed with the patient. The meaning of a false positive PSA and a false negative PSA has been discussed. He indicates understanding of the limitations of this screening test and wishes NOT to proceed with screening PSA testing at this time.  Lab Results  Component Value Date   PSA1 1.2 07/10/2019   PSA1 0.8 08/27/2016   PSA 1.04 03/14/2015    Immunization History  Administered Date(s) Administered   Influenza, High Dose Seasonal PF 05/02/2020   Influenza,inj,Quad PF,6+ Mos 03/14/2015, 03/12/2016, 03/01/2017, 02/28/2018, 02/27/2019   PFIZER Comirnaty(Gray Top)Covid-19 Tri-Sucrose Vaccine 08/07/2019, 08/28/2019, 05/02/2020, 12/30/2020   Td 05/25/2012   Zoster Recombinat (Shingrix)  07/10/2019, 09/18/2019  Flu vaccine today.  Up-to-date on COVID boosters.  No results found. Wears glasses. Appt with optho a year ago.   Dental: no recent visit. No barriers. Plans to schedule  Alcohol: 2 beers per week  Tobacco: none  Exercise: 5 days per week - weightlifting, pickle ball, cycling. Up to 1 hour per day.  No CP/dyspnea with exercise.   History Patient Active Problem List   Diagnosis Date Noted   Osteoarthritis of multiple joints 01/06/2019   Essential hypertension 09/01/2017   Hip pain 09/11/2014   Insomnia 09/11/2014   Pure hypercholesterolemia 09/11/2014   Chronic pain syndrome 09/11/2014   History of total hip arthroplasty 07/26/2013   Osteoarthritis of hip 04/21/2011   Pain due to total hip replacement (HCC) 04/21/2011   RIGHT BUNDLE BRANCH BLOCK 11/01/2009   Past Medical History:  Diagnosis Date   Asymptomatic cholelithiasis    chronic   At risk for sleep apnea    STOP-BANG= 4   History of diverticulitis of colon    Incomplete RBBB    Left ureteral calculus 11/2012, 01/2014   recurrent seen by urology Dr. Vernie Ammons 01/2014   OA (osteoarthritis)    Past Surgical History:  Procedure Laterality Date   CYSTOSCOPY WITH URETEROSCOPY AND STENT PLACEMENT Left 02/02/2014   Procedure: CYSTOSCOPY WITH LEFT RETRGRADE, URETEROSCOPY AND STENT PLACEMENT;  Surgeon: Garnett Farm, MD;  Location: [facility];  Service: Urology;  Laterality: Left;   ELBOW SURGERY Right  2001   HOLMIUM LASER APPLICATION Left 02/02/2014   Procedure: HOLMIUM LASER APPLICATION;  Surgeon: Garnett Farm, MD;  Location: [facility];  Service: Urology;  Laterality: Left;   KNEE ARTHROSCOPY Bilateral left 1984/   right 1996   LEFT ELBOW RECONSTRUCTION COMMON EXTENSOR, LATERAL EPICONDYLE  01-24-2002   TOTAL HIP ARTHROPLASTY Right 10-07-2004   TOTAL HIP REVISION Right 2012   TRANSTHORACIC ECHOCARDIOGRAM  11-26-2009   grade II diastolic dysfunction/  ef 55-60%/   mild AR & MR/  mild aortic sclerosis without stenosis   Allergies  Allergen Reactions   Pollen Extract Other (See Comments)    Unspecified reaction   Prior to Admission medications   Medication Sig Start Date End Date Taking? Authorizing Provider  amLODipine (NORVASC) 5 MG tablet Take 1 tablet (5 mg total) by mouth daily. 08/08/20  Yes Shade Flood, MD  Multiple Vitamins-Minerals (MULTIVITAMIN WITH MINERALS) tablet Take 1 tablet by mouth daily.   Yes [provider]  rosuvastatin (CRESTOR) 5 MG tablet Take 1 tablet (5 mg total) by mouth daily. 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   Drug use: No   Sexual activity: Yes  Other Topics Concern   Not on file  Social History Narrative   Divorced.   Social Determinants of Health   Financial Resource Strain: Not on file  Food Insecurity: Not on file  Transportation Needs: Not on file  Physical Activity: Not on file  Stress: Not on file  Social Connections: Not on file  Intimate Partner Violence: Not on file    Review of Systems 13 point review of systems per patient health survey noted.  Negative other than as indicated above or in HPI.    Objective:   Vitals:   02/13/21 0921  BP: 126/72  Pulse: 71  Resp: 16  Temp: 98.3 F (36.8 C)  TempSrc: Temporal  SpO2: 97%  Weight: 167 lb 9.6 oz (76 kg)  Height: 5\' 9"   (1.753 m)     Physical Exam Vitals reviewed.  Constitutional:      Appearance: He is well-developed.  HENT:     Head: Normocephalic and atraumatic.     Right Ear: External ear normal.     Left Ear: External ear normal.  Eyes:     Conjunctiva/sclera: Conjunctivae normal.     Pupils: Pupils are equal, round, and reactive to light.  Neck:     Thyroid: No thyromegaly.     Vascular: No carotid bruit or JVD.  Cardiovascular:     Rate and Rhythm: Normal rate and regular rhythm.     Heart sounds: Normal heart sounds. No murmur heard. Pulmonary:     Effort: Pulmonary effort is normal. No  respiratory distress.     Breath sounds: Normal breath sounds. No wheezing or rales.  Abdominal:     General: There is no distension.     Palpations: Abdomen is soft.     Tenderness: There is no abdominal tenderness.  Musculoskeletal:        General: No tenderness. Normal range of motion.     Cervical back: Normal range of motion and neck supple.     Right lower leg: No edema.     Left lower leg: No edema.  Lymphadenopathy:     Cervical: No cervical adenopathy.  Skin:    General: Skin is warm and dry.  Neurological:     Mental Status: He is alert and oriented to person, place, and time.     Deep Tendon Reflexes: Reflexes are normal and symmetric.  Psychiatric:        Mood and Affect: Mood normal.        Behavior: Behavior normal.       Assessment & Plan:  Jay Davidson is a 65 y.o. male . Annual physical exam  - -anticipatory guidance as below in AVS, screening labs above. Health maintenance items as above in HPI discussed/recommended as applicable. Dental visit recommended, commended on exercise.   Essential hypertension - Plan: amLODipine (NORVASC) 5 MG tablet, Comprehensive metabolic panel  -  Stable, tolerating current regimen. Medications refilled. Labs pending as above.   Pure hypercholesterolemia - Plan: rosuvastatin (CRESTOR) 5 MG tablet, Lipid panel  -  Stable, tolerating  current regimen. Medications refilled. Labs pending as above.   Screening for diabetes mellitus - Plan: Hemoglobin A1c  Screening for deficiency anemia - Plan: CBC  Need for influenza vaccination - Plan: Flu Vaccine QUAD 6+ mos PF IM (Fluarix Quad PF)   Meds ordered this encounter  Medications   amLODipine (NORVASC) 5 MG tablet    Sig: Take 1 tablet (5 mg total) by mouth daily.    Dispense:  90 tablet    Refill:  1   rosuvastatin (CRESTOR) 5 MG tablet    Sig: Take 1 tablet (5 mg total) by mouth daily.    Dispense:  90 tablet    Refill:  3   Patient Instructions  I do recommend seeing a dentist. If you need a dentist, here is an option: [facility]  Address: [address] [place], [place], [place] [address]  [email or web address]  Phone: [phone]  Keep up the good work with exercise.  Glad to hear that you are feeling well.  No medication changes for now.  Follow-up in 6 months to review your cholesterol and blood pressure, but let me know if there are questions sooner.  Take care.  Preventive Care 51-42 Years Old, Male Preventive care refers to lifestyle choices and visits with your health care provider that can promote health and wellness. This includes: A yearly physical exam. This is also called an annual wellness visit. Regular dental and eye exams. Immunizations. Screening for certain conditions. Healthy lifestyle choices, such as: Eating a healthy diet. Getting regular exercise. Not using drugs or products that contain nicotine and tobacco. Limiting alcohol use. What can I expect for my preventive care visit? Physical exam Your health care provider will check your: Height and weight. These may be used to calculate your BMI (body mass index). BMI is a measurement that tells if you are at a healthy weight. Heart rate and blood pressure. Body temperature. Skin for abnormal spots. Counseling Your health care provider may ask  you questions about your: Past  medical problems. Family's medical history. Alcohol, tobacco, and drug use. Emotional well-being. Home life and relationship well-being. Sexual activity. Diet, exercise, and sleep habits. Work and work Astronomer. Access to firearms. What immunizations do I need? Vaccines are usually given at various ages, according to a schedule. Your health care provider will recommend vaccines for you based on your age, medical history, and lifestyle or other factors, such as travel or where you work. What tests do I need? Blood tests Lipid and cholesterol levels. These may be checked every 5 years, or more often if you are over 66 years old. Hepatitis C test. Hepatitis B test. Screening Lung cancer screening. You may have this screening every year starting at age 110 if you have a 30-pack-year history of smoking and currently smoke or have quit within the past 15 years. Prostate cancer screening. Recommendations will vary depending on your family history and other risks. Genital exam to check for testicular cancer or hernias. Colorectal cancer screening. All adults should have this screening starting at age 37 and continuing until age 72. Your health care provider may recommend screening at age 53 if you are at increased risk. You will have tests every 1-10 years, depending on your results and the type of screening test. Diabetes screening. This is done by checking your blood sugar (glucose) after you have not eaten for a while (fasting). You may have this done every 1-3 years. STD (sexually transmitted disease) testing, if you are at risk. Follow these instructions at home: Eating and drinking  Eat a diet that includes fresh fruits and vegetables, whole grains, lean protein, and low-fat dairy products. Take vitamin and mineral supplements as recommended by your health care provider. Do not drink alcohol if your health care provider tells you not to drink. If you drink alcohol: Limit how much  you have to 0-2 drinks a day. Be aware of how much alcohol is in your drink. In the [place] one drink equals one 12 oz bottle of beer (355 mL), one 5 oz glass of wine (148 mL), or one 1 oz glass of hard liquor (44 mL). Lifestyle Take daily care of your teeth and gums. Brush your teeth every morning and night with fluoride toothpaste. Floss one time each day. Stay active. Exercise for at least 30 minutes 5 or more days each week. Do not use any products that contain nicotine or tobacco, such as cigarettes, e-cigarettes, and chewing tobacco. If you need help quitting, ask your health care provider. Do not use drugs. If you are sexually active, practice safe sex. Use a condom or other form of protection to prevent STIs (sexually transmitted infections). If told by your health care provider, take low-dose aspirin daily starting at age 14. Find healthy ways to cope with stress, such as: Meditation, yoga, or listening to music. Journaling. Talking to a trusted person. Spending time with friends and family. Safety Always wear your seat belt while driving or riding in a vehicle. Do not drive: If you have been drinking alcohol. Do not ride with someone who has been drinking. When you are tired or distracted. While texting. Wear a helmet and other protective equipment during sports activities. If you have firearms in your house, make sure you follow all gun safety procedures. What's next? Go to your health care provider once a year for an annual wellness visit. Ask your health care provider how often you should have your eyes and teeth checked. Stay up to  date on all vaccines. This information is not intended to replace advice given to you by your health care provider. Make sure you discuss any questions you have with your health care provider. Document Revised: 07/19/2020 Document Reviewed: 05/05/2018 Elsevier Patient Education  2022 Elsevier Inc.    Signed,   [name], MD [place]  [facility], [facility] 02/13/21 10:16 AM

## 2021-08-14 ENCOUNTER — Ambulatory Visit: Payer: Managed Care, Other (non HMO) | Admitting: Family Medicine

## 2022-12-07 ENCOUNTER — Encounter: Payer: Self-pay | Admitting: Nurse Practitioner

## 2022-12-07 ENCOUNTER — Ambulatory Visit: Payer: 59 | Admitting: Nurse Practitioner

## 2022-12-07 VITALS — BP 198/78 | HR 98 | Temp 97.9°F | Ht 69.0 in | Wt 163.0 lb

## 2022-12-07 DIAGNOSIS — I4510 Unspecified right bundle-branch block: Secondary | ICD-10-CM

## 2022-12-07 DIAGNOSIS — I10 Essential (primary) hypertension: Secondary | ICD-10-CM

## 2022-12-07 DIAGNOSIS — R110 Nausea: Secondary | ICD-10-CM

## 2022-12-07 DIAGNOSIS — R232 Flushing: Secondary | ICD-10-CM | POA: Diagnosis not present

## 2022-12-07 DIAGNOSIS — R5383 Other fatigue: Secondary | ICD-10-CM

## 2022-12-07 NOTE — Patient Instructions (Signed)
Heart Attack A heart attack occurs when blood and oxygen supply to the heart is cut off. A heart attack can cause damage to the heart that cannot be fixed. A heart attack is also called a myocardial infarction, or MI. If you think you are having a heart attack, do not wait to see if the symptoms will go away. Get medical help right away. What are the causes? This condition may be caused by: A fatty substance (plaque) in the blood vessels (arteries). This can block the flow of blood to the heart. A blood clot in the blood vessels that go to the heart. The blood clot blocks blood flow. An abnormal heartbeat. Some diseases, such as problems in red blood cells (anemia)orproblems in breathing (respiratory failure). Tightening (spasm) of a blood vessel that cuts off blood to the heart. A tear in a blood vessel of the heart. Other causes may include: Using drugs such as cocaine or methamphetamine. Low blood pressure. What increases the risk? Aging. The risk gets higher as you get older. Having a personal or family history of chest pain, heart attack, stroke, or narrowing of the arteries in the legs, arms, head, or stomach (peripheral vascular disease). Having taken chemotherapy or immune-suppressing medicines. Being male. Being overweight or obese. Having any of these conditions: High blood pressure. High cholesterol. Diabetes. Making lifestyle choices such as: Drinking too much alcohol. Not getting regular exercise. Smoking. What are the signs or symptoms? Chest pain. It may feel like: Crushing or squeezing. Tightness, pressure, fullness, or heaviness. Pain in the arm, neck, jaw, back, or upper body. Heartburn. Upset stomach (indigestion). Shortness of breath. Feeling like you may vomit (nauseous). Cold sweats. Sudden light-headedness, dizziness, or passing out. Feeling tired. How is this treated? A heart attack must be treated as soon as possible. Treatment may include: Medicines  to: Break up or dissolve blood clots. Thin your blood and help prevent blood clots. Treat blood pressure. Improve blood flow to the heart. Reduce pain. Reduce cholesterol. Procedures to widen a blocked artery and keep it open. Open heart surgery. Making your heart strong again (cardiac rehabilitation) through exercise, education, and counseling. Follow these instructions at home: Medicines Take over-the-counter and prescription medicines only as told by your doctor. Do not take these medicines unless your doctor says it is okay: NSAIDs, such as ibuprofen, naproxen, or celecoxib. Any vitamins or supplements. Hormone replacement therapy that has estrogen with or without progestin. If you are taking blood thinners: Talk with your doctor before taking any medicines that have aspirin or NSAIDs, such as ibuprofen. Take medicines exactly as told. Take them at the same time each day. Avoid doing things that could hurt or bruise you. Take action to prevent falls. Wear an alert bracelet or carry a card that shows you are taking blood thinners. Lifestyle  Do not smoke or use any products that contain nicotine or tobacco. If you need help quitting, ask your doctor. Avoid secondhand smoke. Exercise regularly. Ask your doctor about a cardiac rehab program. Eat heart-healthy foods. Your doctor will tell you what foods to eat. Stay at a healthy weight. Learn ways to lower your stress level. Do not use illegal drugs. Alcohol use Do not drink alcohol if: Your doctor tells you not to drink. You are pregnant, may be pregnant, or are planning to become pregnant. If you drink alcohol: Limit how much you have to: 0-1 drink a day for women. 0-2 drinks a day for men. Know how much alcohol is in  your drink. In the [place] one drink equals one 12 oz bottle of beer (355 mL), one 5 oz glass of wine (148 mL), or one 1 oz glass of hard liquor (44 mL). General instructions Work with your doctor to treat  other problems you may have, such as diabetes or high blood pressure. Get screened for depression. Get treatment if needed. Keep your vaccines up to date. Get the flu shot (influenza vaccine) every year. Keep all follow-up visits. Contact a doctor if: You feel very sad. You have trouble doing your daily activities. You get light-headed or dizzy. Get help right away if: You have sudden, unexplained discomfort in your chest, arms, back, neck, jaw, or upper body. You have shortness of breath. You have sudden sweating or clammy skin. You feel like you may vomit or you vomit. You feel tired or weak. You feel your heart beating fast. You feel your heart skipping beats. You have blood pressure that is higher than 180/120. These symptoms may be an emergency. Get help right away. Call your local emergency services (911 in the [place] Do not wait to see if the symptoms will go away. Do not drive yourself to the hospital. Summary A heart attack occurs when blood and oxygen supply to the heart is cut off. Do not take NSAIDs unless your doctor says it is okay. Do not smoke. Avoid secondhand smoke. Exercise regularly. Ask your doctor about a cardiac rehab program. This information is not intended to replace advice given to you by your health care provider. Make sure you discuss any questions you have with your health care provider. Document Revised: 10/31/2020 Document Reviewed: 10/31/2020 Elsevier Patient Education  2024 [employer].

## 2022-12-07 NOTE — Progress Notes (Signed)
New Patient Office Visit  Assessment and Plan  1. Encounter to establish care Report to ER for further review and evaluation given presentation of symptoms.  2. Essential hypertension  - EKG 12-Lead  3. RIGHT BUNDLE BRANCH BLOCK  - EKG 12-Lead  4. Nausea  - EKG 12-Lead  5. Flushing  - EKG 12-Lead  6. Other fatigue  - EKG 12-Lead  Subjective    Patient ID: Jay Davidson, male    DOB: 1955-07-08  Age: 67 y.o. MRN: [id number]  CC:  Chief Complaint  Patient presents with   Establish Care    HPI Jay Davidson presents to establish care.  However, upon his visit today he started to report feeling unwell stating symptoms of feeling nauseas, fatigue and that he was coming down with the flu as well as feeling flushed.  He was also tachycardia and BP was original 200/90.  Upon recheck it was 198/78.  Patient states that he checks his BP at home and he runs in the 140's systocially.    Of note, he has not seen a provider in over two years.  Outpatient Encounter Medications as of 12/07/2022  Medication Sig   amLODipine (NORVASC) 5 MG tablet Take 1 tablet (5 mg total) by mouth daily.   Multiple Vitamins-Minerals (MULTIVITAMIN WITH MINERALS) tablet Take 1 tablet by mouth daily.   rosuvastatin (CRESTOR) 5 MG tablet Take 1 tablet (5 mg total) by mouth daily.   No facility-administered encounter medications on file as of 12/07/2022.    Past Medical History:  Diagnosis Date   Asymptomatic cholelithiasis    chronic   At risk for sleep apnea    STOP-BANG= 4   History of diverticulitis of colon    Incomplete RBBB    Left ureteral calculus 11/2012, 01/2014   recurrent seen by urology Dr. Vernie Ammons 01/2014   OA (osteoarthritis)     Past Surgical History:  Procedure Laterality Date   CYSTOSCOPY WITH URETEROSCOPY AND STENT PLACEMENT Left 02/02/2014   Procedure: CYSTOSCOPY WITH LEFT RETRGRADE, URETEROSCOPY AND STENT PLACEMENT;  Surgeon: Garnett Farm, MD;  Location: [facility];  Service: Urology;  Laterality: Left;   ELBOW SURGERY Right 2001   HOLMIUM LASER APPLICATION Left 02/02/2014   Procedure: HOLMIUM LASER APPLICATION;  Surgeon: Garnett Farm, MD;  Location: [facility];  Service: Urology;  Laterality: Left;   KNEE ARTHROSCOPY Bilateral left 1984/   right 1996   LEFT ELBOW RECONSTRUCTION COMMON EXTENSOR, LATERAL EPICONDYLE  01-24-2002   TOTAL HIP ARTHROPLASTY Right 10-07-2004   TOTAL HIP REVISION Right 2012   TRANSTHORACIC ECHOCARDIOGRAM  11-26-2009   grade II diastolic dysfunction/  ef 55-60%/  mild AR & MR/  mild aortic sclerosis without stenosis    Family History  Problem Relation Age of Onset   Hypertension Mother    Mental illness Father     Social History   Socioeconomic History   Marital status: Divorced    Spouse name: Not on file   Number of children: Not on file   Years of education: Not on file   Highest education level: Not on file  Occupational History   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   Drug use: No   Sexual activity: Yes  Other Topics Concern   Not on file  Social History Narrative   Divorced.   Social Determinants of Health   Financial Resource Strain: Not on file  Food Insecurity: Not on file  Transportation Needs: Not on file  Physical Activity: Not on file  Stress: Not on file  Social Connections: Not on file  Intimate Partner Violence: Not on file    ROS      Objective    There were no vitals taken for this visit.  Physical Exam      Assessment & Plan:   Problem  List Items Addressed This Visit   None   No follow-ups on file.   Adela Glimpse, NP

## 2022-12-08 ENCOUNTER — Other Ambulatory Visit: Payer: Self-pay | Admitting: Nurse Practitioner

## 2022-12-08 NOTE — Progress Notes (Signed)
Attempted to contact patient for an update and inquiry on forgoing ER visit yesterday.    Unsuccessful.  LVM for patient to return my call.

## 2022-12-23 ENCOUNTER — Encounter: Payer: Self-pay | Admitting: Nurse Practitioner

## 2022-12-23 ENCOUNTER — Ambulatory Visit (INDEPENDENT_AMBULATORY_CARE_PROVIDER_SITE_OTHER): Payer: 59 | Admitting: Nurse Practitioner

## 2022-12-23 VITALS — BP 132/70 | HR 66 | Temp 97.5°F | Ht 69.0 in | Wt 160.2 lb

## 2022-12-23 DIAGNOSIS — Z96641 Presence of right artificial hip joint: Secondary | ICD-10-CM | POA: Diagnosis not present

## 2022-12-23 DIAGNOSIS — E559 Vitamin D deficiency, unspecified: Secondary | ICD-10-CM | POA: Diagnosis not present

## 2022-12-23 DIAGNOSIS — Z131 Encounter for screening for diabetes mellitus: Secondary | ICD-10-CM

## 2022-12-23 DIAGNOSIS — Z1329 Encounter for screening for other suspected endocrine disorder: Secondary | ICD-10-CM

## 2022-12-23 DIAGNOSIS — Z79899 Other long term (current) drug therapy: Secondary | ICD-10-CM

## 2022-12-23 DIAGNOSIS — I4510 Unspecified right bundle-branch block: Secondary | ICD-10-CM | POA: Diagnosis not present

## 2022-12-23 DIAGNOSIS — E7800 Pure hypercholesterolemia, unspecified: Secondary | ICD-10-CM | POA: Diagnosis not present

## 2022-12-23 DIAGNOSIS — I10 Essential (primary) hypertension: Secondary | ICD-10-CM

## 2022-12-23 LAB — CBC WITH DIFFERENTIAL/PLATELET
Absolute Monocytes: 437 cells/uL (ref 200–950)
Basophils Absolute: 59 cells/uL (ref 0–200)
Basophils Relative: 1.3 %
Eosinophils Absolute: 90 cells/uL (ref 15–500)
Eosinophils Relative: 2 %
HCT: 42.7 % (ref 38.5–50.0)
Hemoglobin: 14.7 g/dL (ref 13.2–17.1)
Lymphs Abs: 1715 cells/uL (ref 850–3900)
MCH: 33.6 pg — ABNORMAL HIGH (ref 27.0–33.0)
MCHC: 34.4 g/dL (ref 32.0–36.0)
MCV: 97.5 fL (ref 80.0–100.0)
MPV: 12.1 fL (ref 7.5–12.5)
Monocytes Relative: 9.7 %
Neutro Abs: 2201 cells/uL (ref 1500–7800)
Neutrophils Relative %: 48.9 %
Platelets: 217 10*3/uL (ref 140–400)
RBC: 4.38 10*6/uL (ref 4.20–5.80)
RDW: 11.7 % (ref 11.0–15.0)
Total Lymphocyte: 38.1 %
WBC: 4.5 10*3/uL (ref 3.8–10.8)

## 2022-12-23 NOTE — Progress Notes (Signed)
Follow Up  Jay Davidson presents today for a follow up.  Below references current diagnostics and plan of care:  Assessment and Plan  Essential hypertension Discussed DASH (Dietary Approaches to Stop Hypertension) DASH diet is lower in sodium than a typical American diet. Cut back on foods that are high in saturated fat, cholesterol, and trans fats. Eat more whole-grain foods, fish, poultry, and nuts Remain active and exercise as tolerated daily.  Monitor BP at home-Call if greater than 130/80.  Check CMP/CBC  - CBC with Differential/Platelet - COMPLETE METABOLIC PANEL WITH GFR  RIGHT BUNDLE BRANCH BLOCK Discussed referral back to cardiology if symptoms present.  - Magnesium  History of total right hip replacement Well healed and managed Continue to monitor  Pure hypercholesterolemia Discussed lifestyle modifications. Recommended diet heavy in fruits and veggies, omega 3's. Decrease consumption of animal meats, cheeses, and dairy products. Remain active and exercise as tolerated. Continue to monitor. Check lipids/TSH  - Lipid panel  Screening for thyroid disorder  - TSH  Screening for diabetes mellitus  - Hemoglobin A1C w/out eAG - Insulin, random  Vitamin D deficiency Continue supplement for goal of 60-100 Monitor Vitamin D levels  - VITAMIN D 25 Hydroxy (Vit-D Deficiency, Fractures)  Medication management All medications discussed and reviewed in full. All questions and concerns regarding medications addressed.    - CBC with Differential/Platelet - COMPLETE METABOLIC PANEL WITH GFR - Magnesium - Lipid panel - TSH - Hemoglobin A1C w/out eAG - Insulin, random - VITAMIN D 25 Hydroxy (Vit-D Deficiency, Fractures)   Notify office for further evaluation and treatment, questions or concerns if any reported s/s fail to improve.   The patient was advised to call back or seek an in-person evaluation if any symptoms worsen or if the condition fails to  improve as anticipated.   Further disposition pending results of labs. Discussed med's effects and SE's.    I discussed the assessment and treatment plan with the patient. The patient was provided an opportunity to ask questions and all were answered. The patient agreed with the plan and demonstrated an understanding of the instructions.  Discussed med's effects and SE's. Screening labs and tests as requested with regular follow-up as recommended.  I provided 25 minutes of face-to-face time during this encounter including counseling, chart review, and critical decision making was preformed.  Today's Plan of Care is based on a patient-centered health care approach known as shared decision making - the decisions, tests and treatments allow for patient preferences and values to be balanced with clinical evidence.     Subjective     HPI Jay Davidson presents for a follow up after establishing car and being asked to report to the ER upon initial visit 12/07/22 due experiencing chest pain, fatigue, tachycardia, nausea and BP 200/90 eventually decreasing down to 132/78.  He states today that he did not present to the ER at that time.   He has not seen a provider in over two years.  He does report that he has taken BP medication Amlodipine and cholesterol medication, Atorvastatin in the past.  Reports following with Cardiology in the past for RBBB.  Currently denies CP, heart palpations, SOB.    Today he has no concerns.  His BP is well controlled in clinic.      He does note a hx of right hip placement without any complaints of chronic pain.    Outpatient Encounter Medications as of 12/23/2022  Medication Sig   amLODipine (NORVASC) 5  MG tablet Take 1 tablet (5 mg total) by mouth daily.   Multiple Vitamins-Minerals (MULTIVITAMIN WITH MINERALS) tablet Take 1 tablet by mouth daily.   rosuvastatin (CRESTOR) 5 MG tablet Take 1 tablet (5 mg total) by mouth daily. (Patient not taking: Reported on  12/23/2022)   No facility-administered encounter medications on file as of 12/23/2022.    Past Medical History:  Diagnosis Date   Asymptomatic cholelithiasis    chronic   At risk for sleep apnea    STOP-BANG= 4   History of diverticulitis of colon    Incomplete RBBB    Left ureteral calculus 11/2012, 01/2014   recurrent seen by urology Dr. Vernie Ammons 01/2014   OA (osteoarthritis)     Past Surgical History:  Procedure Laterality Date   CYSTOSCOPY WITH URETEROSCOPY AND STENT PLACEMENT Left 02/02/2014   Procedure: CYSTOSCOPY WITH LEFT RETRGRADE, URETEROSCOPY AND STENT PLACEMENT;  Surgeon: Garnett Farm, MD;  Location: [facility];  Service: Urology;  Laterality: Left;   ELBOW SURGERY Right 2001   HOLMIUM LASER APPLICATION Left 02/02/2014   Procedure: HOLMIUM LASER APPLICATION;  Surgeon: Garnett Farm, MD;  Location: [facility];  Service: Urology;  Laterality: Left;   KNEE ARTHROSCOPY Bilateral left 1984/   right 1996   LEFT ELBOW RECONSTRUCTION COMMON EXTENSOR, LATERAL EPICONDYLE  01-24-2002   TOTAL HIP ARTHROPLASTY Right 10-07-2004   TOTAL HIP REVISION Right 2012   TRANSTHORACIC ECHOCARDIOGRAM  11-26-2009   grade II diastolic dysfunction/  ef 55-60%/  mild AR & MR/  mild aortic sclerosis without stenosis    Family History  Problem Relation Age of Onset   Hypertension Mother    Mental illness Father     Social History   Socioeconomic History   Marital status: Divorced    Spouse name: Not on file   Number of children: Not on file   Years of education: Not on file   Highest education level: Not on file  Occupational History   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   Drug use: No   Sexual activity: Yes  Other Topics Concern   Not on file  Social History Narrative   Divorced.   Social Determinants of Health   Financial Resource Strain: Not on file  Food Insecurity: Not on file  Transportation Needs: Not on file  Physical Activity: Not on file  Stress: Not on file  Social Connections: Not on file  Intimate Partner Violence: Not on file    Review of Systems  Constitutional: Negative.   HENT: Negative.    Eyes: Negative.   Respiratory: Negative.    Cardiovascular: Negative.   Gastrointestinal: Negative.   Genitourinary: Negative.   Musculoskeletal: Negative.   Skin: Negative.   Neurological: Negative.   Endo/Heme/Allergies: Negative.   Psychiatric/Behavioral: Negative.     Objective    BP (!) 160/72   Pulse 66   Temp (!) 97.5 F (36.4 C)   Ht 5\' 9"  (1.753 m)   Wt  160 lb 3.2 oz (72.7 kg)   SpO2 99%   BMI 23.66 kg/m   Physical Exam Constitutional:      Appearance: Normal appearance.  HENT:     Head: Normocephalic and atraumatic.     Nose: Nose normal.  Eyes:     Pupils: Pupils are equal, round, and reactive to light.  Cardiovascular:     Rate and Rhythm: Normal rate and regular rhythm.     Pulses: Normal pulses.  Pulmonary:     Effort: Pulmonary effort is normal.     Breath sounds: Normal breath sounds.  Musculoskeletal:        General: Normal range of motion.     Cervical back: Normal range of motion.  Skin:    General: Skin is warm.  Neurological:     Mental Status: He is alert and oriented to person, place, and time.    No follow-ups on file.   Adela Glimpse, NP

## 2022-12-24 ENCOUNTER — Encounter: Payer: Self-pay | Admitting: Nurse Practitioner

## 2022-12-24 DIAGNOSIS — E7800 Pure hypercholesterolemia, unspecified: Secondary | ICD-10-CM

## 2022-12-24 DIAGNOSIS — I10 Essential (primary) hypertension: Secondary | ICD-10-CM

## 2022-12-25 MED ORDER — ROSUVASTATIN CALCIUM 5 MG PO TABS
5.0000 mg | ORAL_TABLET | Freq: Every day | ORAL | 3 refills | Status: AC
Start: 2022-12-25 — End: ?

## 2022-12-25 MED ORDER — AMLODIPINE BESYLATE 5 MG PO TABS
5.0000 mg | ORAL_TABLET | Freq: Every day | ORAL | 1 refills | Status: DC
Start: 2022-12-25 — End: 2023-06-07

## 2022-12-29 NOTE — Patient Instructions (Signed)

## 2023-03-25 ENCOUNTER — Ambulatory Visit (INDEPENDENT_AMBULATORY_CARE_PROVIDER_SITE_OTHER): Payer: 59 | Admitting: Nurse Practitioner

## 2023-03-25 ENCOUNTER — Encounter: Payer: Self-pay | Admitting: Nurse Practitioner

## 2023-03-25 VITALS — BP 150/78 | HR 67 | Temp 98.1°F | Ht 69.0 in | Wt 156.0 lb

## 2023-03-25 DIAGNOSIS — E7800 Pure hypercholesterolemia, unspecified: Secondary | ICD-10-CM | POA: Diagnosis not present

## 2023-03-25 DIAGNOSIS — I10 Essential (primary) hypertension: Secondary | ICD-10-CM

## 2023-03-25 DIAGNOSIS — Z79899 Other long term (current) drug therapy: Secondary | ICD-10-CM | POA: Diagnosis not present

## 2023-03-25 DIAGNOSIS — I4510 Unspecified right bundle-branch block: Secondary | ICD-10-CM | POA: Diagnosis not present

## 2023-03-25 LAB — CBC WITH DIFFERENTIAL/PLATELET
Absolute Lymphocytes: 1533 {cells}/uL (ref 850–3900)
Absolute Monocytes: 421 {cells}/uL (ref 200–950)
Basophils Absolute: 70 {cells}/uL (ref 0–200)
Basophils Relative: 1.8 %
Eosinophils Absolute: 70 {cells}/uL (ref 15–500)
Eosinophils Relative: 1.8 %
HCT: 45.4 % (ref 38.5–50.0)
Hemoglobin: 15 g/dL (ref 13.2–17.1)
MCH: 31.3 pg (ref 27.0–33.0)
MCHC: 33 g/dL (ref 32.0–36.0)
MCV: 94.8 fL (ref 80.0–100.0)
MPV: 11 fL (ref 7.5–12.5)
Monocytes Relative: 10.8 %
Neutro Abs: 1806 {cells}/uL (ref 1500–7800)
Neutrophils Relative %: 46.3 %
Platelets: 232 10*3/uL (ref 140–400)
RBC: 4.79 10*6/uL (ref 4.20–5.80)
RDW: 11.2 % (ref 11.0–15.0)
Total Lymphocyte: 39.3 %
WBC: 3.9 10*3/uL (ref 3.8–10.8)

## 2023-03-25 LAB — COMPLETE METABOLIC PANEL WITH GFR
AG Ratio: 2.1 (calc) (ref 1.0–2.5)
ALT: 17 U/L (ref 9–46)
AST: 24 U/L (ref 10–35)
Albumin: 4.6 g/dL (ref 3.6–5.1)
Alkaline phosphatase (APISO): 58 U/L (ref 35–144)
BUN: 14 mg/dL (ref 7–25)
CO2: 29 mmol/L (ref 20–32)
Calcium: 10 mg/dL (ref 8.6–10.3)
Chloride: 105 mmol/L (ref 98–110)
Creat: 1.15 mg/dL (ref 0.70–1.35)
Globulin: 2.2 g/dL (ref 1.9–3.7)
Glucose, Bld: 89 mg/dL (ref 65–99)
Potassium: 5 mmol/L (ref 3.5–5.3)
Sodium: 141 mmol/L (ref 135–146)
Total Bilirubin: 1 mg/dL (ref 0.2–1.2)
Total Protein: 6.8 g/dL (ref 6.1–8.1)
eGFR: 70 mL/min/{1.73_m2} (ref >=60)

## 2023-03-25 LAB — LIPID PANEL
Cholesterol: 188 mg/dL (ref <200)
HDL: 61 mg/dL (ref >=40)
LDL Cholesterol (Calc): 110 mg/dL — ABNORMAL HIGH
Non-HDL Cholesterol (Calc): 127 mg/dL (ref <130)
Total CHOL/HDL Ratio: 3.1 (calc) (ref <5.0)
Triglycerides: 79 mg/dL (ref <150)

## 2023-03-25 NOTE — Progress Notes (Signed)
Follow Up  Jay Davidson presents today for a follow up.  Below references current diagnostics and plan of care:  Assessment and Plan  Essential hypertension Discussed DASH (Dietary Approaches to Stop Hypertension) DASH diet is lower in sodium than a typical American diet. Cut back on foods that are high in saturated fat, cholesterol, and trans fats. Eat more whole-grain foods, fish, poultry, and nuts Remain active and exercise as tolerated daily.  Monitor BP at home-Call if greater than 130/80.  Check CMP/CBC  RIGHT BUNDLE BRANCH BLOCK Discussed referral back to cardiology if symptoms present.  Pure hypercholesterolemia Discussed lifestyle modifications. Recommended diet heavy in fruits and veggies, omega 3's. Decrease consumption of animal meats, cheeses, and dairy products. Remain active and exercise as tolerated. Continue to monitor. Check lipids/TSH  Medication management All medications discussed and reviewed in full. All questions and concerns regarding medications addressed.    Orders Placed This Encounter  Procedures   CBC with Differential/Platelet   COMPLETE METABOLIC PANEL WITH GFR   Lipid panel    Notify office for further evaluation and treatment, questions or concerns if any reported s/s fail to improve.   The patient was advised to call back or seek an in-person evaluation if any symptoms worsen or if the condition fails to improve as anticipated.   Further disposition pending results of labs. Discussed med's effects and SE's.    I discussed the assessment and treatment plan with the patient. The patient was provided an opportunity to ask questions and all were answered. The patient agreed with the plan and demonstrated an understanding of the instructions.  Discussed med's effects and SE's. Screening labs and tests as requested with regular follow-up as recommended.  I provided 30 minutes of face-to-face time during this encounter including counseling,  chart review, and critical decision making was preformed.  Today's Plan of Care is based on a patient-centered health care approach known as shared decision making - the decisions, tests and treatments allow for patient preferences and values to be balanced with clinical evidence.     Subjective     HPI Jay Davidson presents for a 3 month follow up after recently establishing care.  Overall he reports feeling well today - he has no new concerns at this time.    His BP is elevated in clinic.  He is continuing Amlodipine as directed and reports compliance.  States his BP has been well controlled at home averaging 120-130/70-85.  He denies CP, heart palpitations, dizziness, SOB. BP Readings from Last 3 Encounters:  03/25/23 (!) 150/78  12/23/22 132/70  12/07/22 (!) 198/78     He does note a hx of right hip placement without any complaints of chronic pain.    Outpatient Encounter Medications as of 03/25/2023  Medication Sig   amLODipine (NORVASC) 5 MG tablet Take 1 tablet (5 mg total) by mouth daily.   Multiple Vitamins-Minerals (MULTIVITAMIN WITH MINERALS) tablet Take 1 tablet by mouth daily.   rosuvastatin (CRESTOR) 5 MG tablet Take 1 tablet (5 mg total) by mouth daily.   No facility-administered encounter medications on file as of 03/25/2023.    Past Medical History:  Diagnosis Date   Asymptomatic cholelithiasis    chronic   At risk for sleep apnea    STOP-BANG= 4   History of diverticulitis of colon    Incomplete RBBB    Left ureteral calculus 11/2012, 01/2014   recurrent seen by urology Dr. Vernie Ammons 01/2014   OA (osteoarthritis)  Past Surgical History:  Procedure Laterality Date   CYSTOSCOPY WITH URETEROSCOPY AND STENT PLACEMENT Left 02/02/2014   Procedure: CYSTOSCOPY WITH LEFT RETRGRADE, URETEROSCOPY AND STENT PLACEMENT;  Surgeon: Garnett Farm, MD;  Location: [facility];  Service: Urology;  Laterality: Left;   ELBOW SURGERY Right 2001   HOLMIUM  LASER APPLICATION Left 02/02/2014   Procedure: HOLMIUM LASER APPLICATION;  Surgeon: Garnett Farm, MD;  Location: [facility];  Service: Urology;  Laterality: Left;   KNEE ARTHROSCOPY Bilateral left 1984/   right 1996   LEFT ELBOW RECONSTRUCTION COMMON EXTENSOR, LATERAL EPICONDYLE  01-24-2002   TOTAL HIP ARTHROPLASTY Right 10-07-2004   TOTAL HIP REVISION Right 2012   TRANSTHORACIC ECHOCARDIOGRAM  11-26-2009   grade II diastolic dysfunction/  ef 55-60%/  mild AR & MR/  mild aortic sclerosis without stenosis    Family History  Problem Relation Age of Onset   Hypertension Mother    Mental illness Father     Social History   Socioeconomic History   Marital status: Divorced    Spouse name: Not on file   Number of children: Not on file   Years of education: Not on file   Highest education level: Not on file  Occupational History   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   Drug use: No   Sexual activity: Yes  Other Topics Concern   Not on file  Social History Narrative   Divorced.   Social Determinants of Health   Financial Resource Strain: Not on file  Food Insecurity: Not on file  Transportation Needs: Not on file  Physical Activity: Not on file  Stress: Not on file  Social Connections: Not on file  Intimate Partner Violence: Not on file    Review of Systems  Constitutional: Negative.   HENT: Negative.    Eyes: Negative.   Respiratory: Negative.    Cardiovascular: Negative.   Gastrointestinal: Negative.   Genitourinary: Negative.   Musculoskeletal:  Negative.   Skin: Negative.   Neurological: Negative.   Endo/Heme/Allergies: Negative.   Psychiatric/Behavioral: Negative.     Objective    BP (!) 150/78   Pulse 67   Temp 98.1 F (36.7 C)   Ht 5\' 9"  (1.753 m)   Wt 156 lb (70.8 kg)   SpO2 98%   BMI 23.04 kg/m   Physical Exam Constitutional:      Appearance: Normal appearance.  HENT:     Head: Normocephalic and atraumatic.     Nose: Nose normal.  Eyes:     Pupils: Pupils are equal, round, and reactive to light.  Cardiovascular:     Rate and Rhythm: Normal rate and regular rhythm.     Pulses: Normal pulses.  Pulmonary:     Effort: Pulmonary effort is normal.     Breath sounds: Normal breath sounds.  Musculoskeletal:        General: Normal range of motion.     Cervical back: Normal range of motion.  Skin:  General: Skin is warm.  Neurological:     Mental Status: He is alert and oriented to person, place, and time.    Return in about 6 months (around 09/22/2023).   Adela Glimpse, NP

## 2023-03-25 NOTE — Patient Instructions (Signed)

## 2023-06-07 ENCOUNTER — Other Ambulatory Visit: Payer: Self-pay | Admitting: Nurse Practitioner

## 2023-06-07 ENCOUNTER — Encounter: Payer: Self-pay | Admitting: Nurse Practitioner

## 2023-06-07 DIAGNOSIS — I10 Essential (primary) hypertension: Secondary | ICD-10-CM

## 2023-06-07 MED ORDER — AMLODIPINE BESYLATE 5 MG PO TABS: 5.0000 mg | ORAL_TABLET | Freq: Every day | ORAL | 1 refills | Status: AC

## 2023-09-28 ENCOUNTER — Ambulatory Visit: Payer: 59 | Admitting: Nurse Practitioner
